# Patient Record
Sex: Female | Born: 1950 | Race: Black or African American | Hispanic: No | Marital: Married | Smoking: Former smoker
Health system: Southern US, Community
[De-identification: ages and names within clinical notes are randomized; demographics above are authoritative.]

## PROBLEM LIST (undated history)

## (undated) DIAGNOSIS — E785 Hyperlipidemia, unspecified: Secondary | ICD-10-CM

## (undated) DIAGNOSIS — I10 Essential (primary) hypertension: Secondary | ICD-10-CM

## (undated) DIAGNOSIS — J45909 Unspecified asthma, uncomplicated: Secondary | ICD-10-CM

## (undated) HISTORY — DX: Unspecified asthma, uncomplicated: J45.909

## (undated) HISTORY — DX: Essential (primary) hypertension: I10

## (undated) HISTORY — PX: COLONOSCOPY: SHX174

## (undated) HISTORY — DX: Hyperlipidemia, unspecified: E78.5

---

## 1981-09-08 HISTORY — PX: ABDOMINAL HYSTERECTOMY: SHX81

## 2018-10-04 ENCOUNTER — Ambulatory Visit: Payer: Self-pay | Admitting: Physician Assistant

## 2018-11-08 ENCOUNTER — Ambulatory Visit
Admission: RE | Admit: 2018-11-08 | Discharge: 2018-11-08 | Disposition: A | Discharge status: Home / Self Care | Payer: Medicare HMO | Source: Ambulatory Visit | Attending: Family Medicine | Admitting: Family Medicine

## 2018-11-08 ENCOUNTER — Other Ambulatory Visit: Payer: Self-pay | Admitting: Family Medicine

## 2018-11-08 DIAGNOSIS — M542 Cervicalgia: Secondary | ICD-10-CM

## 2019-08-25 ENCOUNTER — Other Ambulatory Visit: Payer: Self-pay

## 2019-08-25 DIAGNOSIS — B019 Varicella without complication: Secondary | ICD-10-CM | POA: Insufficient documentation

## 2019-08-25 DIAGNOSIS — E782 Mixed hyperlipidemia: Secondary | ICD-10-CM | POA: Insufficient documentation

## 2019-08-25 DIAGNOSIS — Z9289 Personal history of other medical treatment: Secondary | ICD-10-CM | POA: Insufficient documentation

## 2019-08-25 DIAGNOSIS — J4540 Moderate persistent asthma, uncomplicated: Secondary | ICD-10-CM | POA: Insufficient documentation

## 2019-08-25 DIAGNOSIS — I10 Essential (primary) hypertension: Secondary | ICD-10-CM | POA: Insufficient documentation

## 2019-08-25 HISTORY — DX: Personal history of other medical treatment: Z92.89

## 2019-09-29 ENCOUNTER — Other Ambulatory Visit: Payer: Self-pay

## 2019-09-30 ENCOUNTER — Ambulatory Visit (INDEPENDENT_AMBULATORY_CARE_PROVIDER_SITE_OTHER): Payer: Medicare HMO | Admitting: Family Medicine

## 2019-09-30 ENCOUNTER — Encounter: Payer: Self-pay | Admitting: Family Medicine

## 2019-09-30 VITALS — BP 156/88 | HR 101 | Temp 97.9°F | Ht <= 58 in | Wt 154.4 lb

## 2019-09-30 DIAGNOSIS — I10 Essential (primary) hypertension: Secondary | ICD-10-CM | POA: Diagnosis not present

## 2019-09-30 DIAGNOSIS — M47816 Spondylosis without myelopathy or radiculopathy, lumbar region: Secondary | ICD-10-CM

## 2019-09-30 DIAGNOSIS — Z1231 Encounter for screening mammogram for malignant neoplasm of breast: Secondary | ICD-10-CM | POA: Diagnosis not present

## 2019-09-30 DIAGNOSIS — H409 Unspecified glaucoma: Secondary | ICD-10-CM

## 2019-09-30 DIAGNOSIS — L2084 Intrinsic (allergic) eczema: Secondary | ICD-10-CM

## 2019-09-30 DIAGNOSIS — E669 Obesity, unspecified: Secondary | ICD-10-CM | POA: Diagnosis not present

## 2019-09-30 DIAGNOSIS — M545 Low back pain, unspecified: Secondary | ICD-10-CM

## 2019-09-30 DIAGNOSIS — Z0000 Encounter for general adult medical examination without abnormal findings: Secondary | ICD-10-CM

## 2019-09-30 DIAGNOSIS — L304 Erythema intertrigo: Secondary | ICD-10-CM | POA: Diagnosis not present

## 2019-09-30 DIAGNOSIS — E2839 Other primary ovarian failure: Secondary | ICD-10-CM | POA: Diagnosis not present

## 2019-09-30 DIAGNOSIS — G8929 Other chronic pain: Secondary | ICD-10-CM | POA: Insufficient documentation

## 2019-09-30 DIAGNOSIS — E782 Mixed hyperlipidemia: Secondary | ICD-10-CM

## 2019-09-30 DIAGNOSIS — J4520 Mild intermittent asthma, uncomplicated: Secondary | ICD-10-CM | POA: Diagnosis not present

## 2019-09-30 HISTORY — DX: Unspecified glaucoma: H40.9

## 2019-09-30 HISTORY — DX: Obesity, unspecified: E66.9

## 2019-09-30 HISTORY — DX: Spondylosis without myelopathy or radiculopathy, lumbar region: M47.816

## 2019-09-30 MED ORDER — TRIAMCINOLONE ACETONIDE 0.1 % EX CREA: 1.0000 "application " | TOPICAL_CREAM | Freq: Two times a day (BID) | CUTANEOUS | 0 refills | Status: DC

## 2019-09-30 MED ORDER — KETOCONAZOLE 2 % EX CREA: 1.0000 "application " | TOPICAL_CREAM | Freq: Two times a day (BID) | CUTANEOUS | 0 refills | Status: AC

## 2019-09-30 NOTE — Patient Instructions (Addendum)
Please return in 6 months for follow up of your hypertension. Medicare recommends an Annual Wellness Visit for all patients. Please schedule this to be done with our Nurse Educator, Loma Sousa. This is an informative "talk" visit; it's goals are to ensure that your health care needs are being met and to give you education regarding avoiding falls, ensuring you are not suffering from depression or problems with memory or thinking, and to educate you on Advance Care Planning. It helps me take good care of you!  Your blood pressure is elevated today. Please check at home and return if readings remain > 140/90 for medication adjustment.   Please sign up for Mychart; we sent you an email with a link.  I will release your lab results to you on your MyChart account with further instructions. Please reply with any questions.   Please try to get records for your last colonoscopy, your last stool testing, and your immunizations so I may document them in your chart and ensure everything is up to date.   I have ordered a mammogram and bone density test. We will call you to get that scheduled.   It was a pleasure meeting you today! Thank you for choosing [place] to meet your healthcare needs! I truly look forward to working with you. If you have any questions or concerns, please send me a message via Mychart or call the office at [phone].   Hypertension, Adult High blood pressure (hypertension) is when the force of blood pumping through the arteries is too strong. The arteries are the blood vessels that carry blood from the heart throughout the body. Hypertension forces the heart to work harder to pump blood and may cause arteries to become narrow or stiff. Untreated or uncontrolled hypertension can cause a heart attack, heart failure, a stroke, kidney disease, and other problems. A blood pressure reading consists of a higher number over a lower number. Ideally, your blood pressure should be below 120/80. The  first ("top") number is called the systolic pressure. It is a measure of the pressure in your arteries as your heart beats. The second ("bottom") number is called the diastolic pressure. It is a measure of the pressure in your arteries as the heart relaxes. What are the causes? The exact cause of this condition is not known. There are some conditions that result in or are related to high blood pressure. What increases the risk? Some risk factors for high blood pressure are under your control. The following factors may make you more likely to develop this condition:  Smoking.  Having type 2 diabetes mellitus, high cholesterol, or both.  Not getting enough exercise or physical activity.  Being overweight.  Having too much fat, sugar, calories, or salt (sodium) in your diet.  Drinking too much alcohol. Some risk factors for high blood pressure may be difficult or impossible to change. Some of these factors include:  Having chronic kidney disease.  Having a family history of high blood pressure.  Age. Risk increases with age.  Race. You may be at higher risk if you are African American.  Gender. Men are at higher risk than women before age 56. After age 78, women are at higher risk than men.  Having obstructive sleep apnea.  Stress. What are the signs or symptoms? High blood pressure may not cause symptoms. Very high blood pressure (hypertensive crisis) may cause:  Headache.  Anxiety.  Shortness of breath.  Nosebleed.  Nausea and vomiting.  Vision changes.  Severe  chest pain.  Seizures. How is this diagnosed? This condition is diagnosed by measuring your blood pressure while you are seated, with your arm resting on a flat surface, your legs uncrossed, and your feet flat on the floor. The cuff of the blood pressure monitor will be placed directly against the skin of your upper arm at the level of your heart. It should be measured at least twice using the same arm.  Certain conditions can cause a difference in blood pressure between your right and left arms. Certain factors can cause blood pressure readings to be lower or higher than normal for a short period of time:  When your blood pressure is higher when you are in a health care provider's office than when you are at home, this is called white coat hypertension. Most people with this condition do not need medicines.  When your blood pressure is higher at home than when you are in a health care provider's office, this is called masked hypertension. Most people with this condition may need medicines to control blood pressure. If you have a high blood pressure reading during one visit or you have normal blood pressure with other risk factors, you may be asked to:  Return on a different day to have your blood pressure checked again.  Monitor your blood pressure at home for 1 week or longer. If you are diagnosed with hypertension, you may have other blood or imaging tests to help your health care provider understand your overall risk for other conditions. How is this treated? This condition is treated by making healthy lifestyle changes, such as eating healthy foods, exercising more, and reducing your alcohol intake. Your health care provider may prescribe medicine if lifestyle changes are not enough to get your blood pressure under control, and if:  Your systolic blood pressure is above 130.  Your diastolic blood pressure is above 80. Your personal target blood pressure may vary depending on your medical conditions, your age, and other factors. Follow these instructions at home: Eating and drinking   Eat a diet that is high in fiber and potassium, and low in sodium, added sugar, and fat. An example eating plan is called the DASH (Dietary Approaches to Stop Hypertension) diet. To eat this way: ? Eat plenty of fresh fruits and vegetables. Try to fill one half of your plate at each meal with fruits and  vegetables. ? Eat whole grains, such as whole-wheat pasta, brown rice, or whole-grain bread. Fill about one fourth of your plate with whole grains. ? Eat or drink low-fat dairy products, such as skim milk or low-fat yogurt. ? Avoid fatty cuts of meat, processed or cured meats, and poultry with skin. Fill about one fourth of your plate with lean proteins, such as fish, chicken without skin, beans, eggs, or tofu. ? Avoid pre-made and processed foods. These tend to be higher in sodium, added sugar, and fat.  Reduce your daily sodium intake. Most people with hypertension should eat less than 1,500 mg of sodium a day.  Do not drink alcohol if: ? Your health care provider tells you not to drink. ? You are pregnant, may be pregnant, or are planning to become pregnant.  If you drink alcohol: ? Limit how much you use to:  0-1 drink a day for women.  0-2 drinks a day for men. ? Be aware of how much alcohol is in your drink. In the [place] one drink equals one 12 oz bottle of beer (355 mL), one  5 oz glass of wine (148 mL), or one 1 oz glass of hard liquor (44 mL). Lifestyle   Work with your health care provider to maintain a healthy body weight or to lose weight. Ask what an ideal weight is for you.  Get at least 30 minutes of exercise most days of the week. Activities may include walking, swimming, or biking.  Include exercise to strengthen your muscles (resistance exercise), such as Pilates or lifting weights, as part of your weekly exercise routine. Try to do these types of exercises for 30 minutes at least 3 days a week.  Do not use any products that contain nicotine or tobacco, such as cigarettes, e-cigarettes, and chewing tobacco. If you need help quitting, ask your health care provider.  Monitor your blood pressure at home as told by your health care provider.  Keep all follow-up visits as told by your health care provider. This is important. Medicines  Take over-the-counter and  prescription medicines only as told by your health care provider. Follow directions carefully. Blood pressure medicines must be taken as prescribed.  Do not skip doses of blood pressure medicine. Doing this puts you at risk for problems and can make the medicine less effective.  Ask your health care provider about side effects or reactions to medicines that you should watch for. Contact a health care provider if you:  Think you are having a reaction to a medicine you are taking.  Have headaches that keep coming back (recurring).  Feel dizzy.  Have swelling in your ankles.  Have trouble with your vision. Get help right away if you:  Develop a severe headache or confusion.  Have unusual weakness or numbness.  Feel faint.  Have severe pain in your chest or abdomen.  Vomit repeatedly.  Have trouble breathing. Summary  Hypertension is when the force of blood pumping through your arteries is too strong. If this condition is not controlled, it may put you at risk for serious complications.  Your personal target blood pressure may vary depending on your medical conditions, your age, and other factors. For most people, a normal blood pressure is less than 120/80.  Hypertension is treated with lifestyle changes, medicines, or a combination of both. Lifestyle changes include losing weight, eating a healthy, low-sodium diet, exercising more, and limiting alcohol. This information is not intended to replace advice given to you by your health care provider. Make sure you discuss any questions you have with your health care provider. Document Revised: 05/05/2018 Document Reviewed: 05/05/2018 Elsevier Patient Education  2020 [employer].

## 2019-09-30 NOTE — Progress Notes (Signed)
Subjective  CC:  Chief Complaint  Patient presents with   New Patient (Initial Visit)   Skin rashes   Sleep problems    lack of sleep    HPI: Kathleen Morse is a 69 y.o. female who presents to [facility] today to establish care with me as a new patient.  No records available for review.  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  Reports she has been seeing another primary care doctor in the area who recently left.  She is unable to get old records.  Her main medical problems include hypertension, hyperlipidemia and intermittent asthma.  Hypertension: She reports good control.  Is elevated here in the office today she thinks this is related to anxiety.  She is only on amlodipine.  I did review old lab results from her patient portal on her phone, normal kidney function potassium from March 2020.  She denies history of CAD or diabetes.  She has no chest pain.  Normal extremity edema.  Hyperlipidemia on high-dose pravastatin with most recent LDL of 156 in March 2020.  Tolerates well.  Asthma: Reports uses albuterol rarely.  In her medical chart she does have history of Singulair and Advair but she says she does not use them.  She denies any recent hospitalizations.  She has had lifelong asthma.  Remote history of smoking about 30 or 40 years ago.  No known COPD.  Currently asymptomatic  Mild glaucoma.  She needs a new eye doctor  Health maintenance: She reports she had some type of colon cancer screening kit sent to her home last year that was negative.  Uncertain if this was Cologuard or FIT test.  She reports she had a colonoscopy back in 2013 and that was normal.  No family history of colon cancer.  She is overdue for mammogram.  She is status post hysterectomy, partial for fibroids.  No longer needs Pap smears.  She reports  her immunizations are up-to-date but is on certain about what she had when she had it.  Possibly had one Shingrix at [facility] recently.  She may have had her pneumonia vaccinations but were unclear.  She did take a flu shot this year.  New problem: Rash beneath bilateral breast that she has been there intermittently on and off for months.  Also gets red dry flaking spots on her extremities.  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  She reports it is mild.  She manages behaviorally.  She had naproxen and Ultram on her medication list from before but no longer uses these.  No lower extremity weakness.  No bowel or bladder dysfunction.  Assessment  1. Annual physical exam   2. Essential hypertension   3. Glaucoma, unspecified glaucoma type, unspecified laterality   4. Mild intermittent asthma without complication   5. Mixed hyperlipidemia   6. Encounter for screening mammogram for breast cancer   7. Hypoestrogenism   8. Intrinsic eczema   9. Intertrigo   10. 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 Obesity (BMI 30-39.9)      Plan  Female Wellness Visit:  Age appropriate Health Maintenance and Prevention measures were discussed with patient. Included topics are cancer screening recommendations, ways to keep healthy (see AVS) including dietary and  exercise recommendations, regular eye and dental care, use of seat belts, and avoidance of moderate alcohol use and tobacco use.  Mammogram and bone density ordered.  We will try to get clarification on when her last colorectal cancer screening done and what it was.  I believe she is up-to-date.  BMI: discussed patient's BMI and encouraged positive lifestyle modifications to help get to or maintain a target BMI.  HM needs and immunizations were addressed and ordered. See below for orders. See HM and immunization section for updates.  Request records to see what immunization she  has had a lot she is due for.  Routine labs and screening tests ordered including cmp, cbc and lipids where appropriate.  Discussed recommendations regarding Vit D and calcium supplementation (see AVS)  Chronic problems:  Hypertension: Uncontrolled today but this could be beneficial.  Recommend home monitoring and recheck if remains elevated.  Continue amlodipine.  Check lab work today.  Hyperlipidemia on statin.  Recheck lipids today.  If LDL remains elevated will change to different high-dose statin.  Patient understands and agrees with care plan.  Check LFTs.  Obesity: Recommend improving diet.  Mild intermittent asthma by patient report.  Continue as needed albuterol.  Will need to monitor her breathing and consider PFTs.  Eczema and intertrigo: Educated and treat with ketoconazole and triamcinolone.  Chronic back pain: Manageable behaviorally.  Mild.  Will monitor    Follow up:  Return in about 6 months (around 03/29/2020) for follow up Hypertension, AWV at patient's convenience. Orders Placed This Encounter  Procedures   MM DIGITAL SCREENING BILATERAL   DG Bone Density   CBC with Differential/Platelet   Comprehensive metabolic panel   Hepatitis C antibody   Lipid panel   TSH   Meds ordered this encounter  Medications   ketoconazole (NIZORAL) 2 % cream    Sig: Apply 1 application topically 2 (two) times daily for 7 days. Then as needed to rash beneath breasts    Dispense:  30 g    Refill:  0   triamcinolone cream (KENALOG) 0.1 %    Sig: Apply 1 application topically 2 (two) times daily. For 2 weeks, then as needed    Dispense:  45 g    Refill:  0     Depression screen [facility] 2/9 09/30/2019  Decreased Interest 0  Down, Depressed, Hopeless 0  PHQ - 2 Score 0    We updated and reviewed the patient's past history in detail and it is documented below.  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 09/30/2019   Obesity (BMI 30-39.9) 09/30/2019   Moderate persistent asthma 08/25/2019   Essential hypertension 08/25/2019   Mixed hyperlipidemia 08/25/2019   Health Maintenance  Topic Date Due   Hepatitis C Screening  08/10/1951   MAMMOGRAM  09/18/2001   DEXA SCAN  09/18/2016   PNA vac Low Risk Adult (2 of 2 - PCV13) 08/07/2020   Fecal DNA (Cologuard)  05/09/2022   TETANUS/TDAP  07/07/2029   INFLUENZA VACCINE  Completed   Immunization History  Administered Date(s) Administered   Influenza, High Dose Seasonal PF 06/09/2018   Influenza,inj,Quad PF,6+ Mos 06/08/2019   Pneumococcal Polysaccharide-23 08/08/2019   Tdap 07/08/2019   Zoster Recombinat (Shingrix) 07/08/2019   Current Meds  Medication Sig   albuterol (VENTOLIN HFA) 108 (90 Base) MCG/ACT inhaler Inhale  into the lungs every 6 (six) hours as needed for wheezing or shortness of breath.   amLODipine (NORVASC) 10 MG tablet Take 10 mg by mouth daily.   b complex vitamins capsule Take 1 capsule by mouth daily.   co-enzyme Q-10 30 MG capsule Take 30 mg by mouth 3 (three) times daily.   latanoprost (XALATAN) 0.005 % ophthalmic solution 1 drop at bedtime.   pravastatin (PRAVACHOL) 80 MG tablet Take 80 mg by mouth daily.   [DISCONTINUED] clobetasol cream (TEMOVATE) 9.41 % Apply 1 application topically 2 (two) times daily.   [DISCONTINUED] Fluticasone-Salmeterol (ADVAIR) 100-50 MCG/DOSE AEPB Inhale 1 puff into the lungs 2 (two) times daily.   [DISCONTINUED] montelukast (SINGULAIR) 10 MG tablet Take 10 mg by mouth at bedtime.   [DISCONTINUED] naproxen (NAPROSYN) 500 MG tablet Take 500 mg by mouth 2 (two) times daily with a meal.    Allergies: Patient is allergic to penicillins. 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 Past Surgical History Patient  has a past surgical history that includes Abdominal hysterectomy (1983) and Cesarean section (1971). Family History: Patient family history includes Arthritis in her mother; Birth defects in her mother; Cancer in her sister; Depression in her father; Diabetes in her mother; Heart attack in her maternal grandmother; Stroke in her maternal grandfather. Social History:  Patient  reports that she has quit smoking. She has never used smokeless tobacco. She reports current alcohol use. She reports that she does not use drugs.  Review of Systems: Constitutional: negative for fever or malaise Ophthalmic: negative for photophobia, double vision or loss of vision Cardiovascular: negative for chest pain, dyspnea on exertion, or new LE swelling Respiratory: negative for SOB or persistent cough Gastrointestinal: negative for abdominal pain, change in bowel habits or melena Genitourinary: negative for dysuria or gross hematuria Musculoskeletal: negative for new gait disturbance or muscular weakness Integumentary: negative for new or persistent rashes Neurological: negative for TIA or stroke symptoms Psychiatric: negative for SI or delusions Allergic/Immunologic: negative for hives  Patient Care Team    Relationship Specialty Notifications Start End  Leamon Arnt, MD PCP - General Family Medicine  09/30/19     Objective  Vitals: BP (!) 156/88 (BP Location: Left Arm, Patient Position: Sitting, Cuff Size: Normal)    Pulse (!) 101    Temp 97.9 F (36.6 C) (Temporal)    Ht '4\' 10"'  (1.473 m)    Wt 154 lb 6.4 oz (70 kg)    SpO2 97%    BMI 32.27 kg/m  General:  Well developed, well nourished, no acute distress  Psych:  Alert and oriented,normal mood and affect HEENT:  Normocephalic, atraumatic, non-icteric sclera, PERRL,  oropharynx is without mass or exudate, supple neck without adenopathy, mass or thyromegaly Cardiovascular:  RRR without gallop, rub or murmur, nondisplaced PMI Respiratory:  Good breath sounds bilaterally, CTAB with normal respiratory effort Gastrointestinal: normal bowel sounds, soft, non-tender, no noted masses. No HSM MSK: no deformities, contusions. Joints are without erythema or swelling Skin:  Warm, erythematous moist rash beneath bilateral breast: Small red flaking rash on left forearm.  No suspicious lesions Neurologic:    Mental status is normal. Gross motor and sensory exams are normal. Normal gait   Commons side effects, risks, benefits, and alternatives for medications and treatment plan prescribed today were discussed, and the patient  expressed understanding of the given instructions. Patient is instructed to call or message via MyChart if he/she has any questions or concerns regarding our treatment plan. No barriers to understanding were identified. We discussed Red Flag symptoms and signs in detail. Patient expressed understanding regarding what to do in case of urgent or emergency type symptoms.   Medication list was reconciled, printed and provided to the patient in AVS. Patient instructions and summary information was reviewed with the patient as documented in the AVS. This note was prepared with assistance of Dragon voice recognition software. Occasional wrong-word or sound-a-like substitutions may have occurred due to the inherent limitations of voice recognition software  This visit occurred during the SARS-CoV-2 public health emergency.  Safety protocols were in place, including screening questions prior to the visit, additional usage of staff PPE, and extensive cleaning of exam room while observing appropriate contact time as indicated for disinfecting solutions.

## 2019-10-01 NOTE — Progress Notes (Signed)
Please call patient: I have reviewed his/her lab results. Lab results appear stable.  Cholesterol level is improved from last year. WBC is elevated: this may be chronic but need old records to verify. Will recheck again at next visit.  Other labs are all fine. No changes recommended at this time. thanks

## 2019-10-03 LAB — CBC WITH DIFFERENTIAL/PLATELET
Absolute Monocytes: 773 cells/uL (ref 200–950)
Basophils Absolute: 55 cells/uL (ref 0–200)
Basophils Relative: 0.4 %
Eosinophils Absolute: 193 cells/uL (ref 15–500)
Eosinophils Relative: 1.4 %
HCT: 39.4 % (ref 35.0–45.0)
Hemoglobin: 13.1 g/dL (ref 11.7–15.5)
Lymphs Abs: 4250 cells/uL — ABNORMAL HIGH (ref 850–3900)
MCH: 29.6 pg (ref 27.0–33.0)
MCHC: 33.2 g/dL (ref 32.0–36.0)
MCV: 88.9 fL (ref 80.0–100.0)
MPV: 10.6 fL (ref 7.5–12.5)
Monocytes Relative: 5.6 %
Neutro Abs: 8528 cells/uL — ABNORMAL HIGH (ref 1500–7800)
Neutrophils Relative %: 61.8 %
Platelets: 382 10*3/uL (ref 140–400)
RBC: 4.43 10*6/uL (ref 3.80–5.10)
RDW: 12.6 % (ref 11.0–15.0)
Total Lymphocyte: 30.8 %
WBC: 13.8 10*3/uL — ABNORMAL HIGH (ref 3.8–10.8)

## 2019-10-03 LAB — COMPREHENSIVE METABOLIC PANEL
AG Ratio: 1.3 (calc) (ref 1.0–2.5)
ALT: 17 U/L (ref 6–29)
AST: 19 U/L (ref 10–35)
Albumin: 4.2 g/dL (ref 3.6–5.1)
Alkaline phosphatase (APISO): 55 U/L (ref 37–153)
BUN: 12 mg/dL (ref 7–25)
CO2: 27 mmol/L (ref 20–32)
Calcium: 10.4 mg/dL (ref 8.6–10.4)
Chloride: 101 mmol/L (ref 98–110)
Creat: 0.88 mg/dL (ref 0.50–0.99)
Globulin: 3.2 g/dL (calc) (ref 1.9–3.7)
Glucose, Bld: 89 mg/dL (ref 65–99)
Potassium: 3.8 mmol/L (ref 3.5–5.3)
Sodium: 141 mmol/L (ref 135–146)
Total Bilirubin: 0.4 mg/dL (ref 0.2–1.2)
Total Protein: 7.4 g/dL (ref 6.1–8.1)

## 2019-10-03 LAB — LIPID PANEL
Cholesterol: 200 mg/dL — ABNORMAL HIGH (ref <200)
HDL: 52 mg/dL (ref >=50)
LDL Cholesterol (Calc): 126 mg/dL (calc) — ABNORMAL HIGH
Non-HDL Cholesterol (Calc): 148 mg/dL (calc) — ABNORMAL HIGH (ref <130)
Total CHOL/HDL Ratio: 3.8 (calc) (ref <5.0)
Triglycerides: 111 mg/dL (ref <150)

## 2019-10-03 LAB — HEPATITIS C ANTIBODY
Hepatitis C Ab: NONREACTIVE
SIGNAL TO CUT-OFF: 0.01 (ref <1.00)

## 2019-10-03 LAB — TSH: TSH: 1.74 mIU/L (ref 0.40–4.50)

## 2019-10-25 ENCOUNTER — Telehealth: Payer: Self-pay

## 2019-10-25 MED ORDER — AMLODIPINE BESYLATE 10 MG PO TABS: 10.0000 mg | ORAL_TABLET | Freq: Every day | ORAL | 3 refills | Status: DC

## 2019-10-25 NOTE — Telephone Encounter (Signed)
MEDICATION:amLODipine ([place]) 10 MG tablet  PHARMACY: [place], [place] Phone:  [phone]  Fax:  [phone]       Comments:   **Let patient know to contact pharmacy at the end of the day to make sure medication is ready. **  ** Please notify patient to allow 48-72 hours to process**  **Encourage patient to contact the pharmacy for refills or they can request refills through [facility]**

## 2019-10-25 NOTE — Addendum Note (Signed)
Addended by: Clyde Lundborg A on: 10/25/2019 10:16 AM   Modules accepted: Orders

## 2019-10-25 NOTE — Telephone Encounter (Signed)
Medication refilled

## 2019-12-07 ENCOUNTER — Other Ambulatory Visit: Payer: Self-pay

## 2019-12-07 MED ORDER — ALBUTEROL SULFATE HFA 108 (90 BASE) MCG/ACT IN AERS: 1.0000 | INHALATION_SPRAY | Freq: Four times a day (QID) | RESPIRATORY_TRACT | 1 refills | Status: DC | PRN

## 2019-12-19 ENCOUNTER — Ambulatory Visit (INDEPENDENT_AMBULATORY_CARE_PROVIDER_SITE_OTHER): Payer: Medicare HMO

## 2019-12-19 ENCOUNTER — Other Ambulatory Visit: Payer: Self-pay

## 2019-12-19 DIAGNOSIS — Z1211 Encounter for screening for malignant neoplasm of colon: Secondary | ICD-10-CM | POA: Diagnosis not present

## 2019-12-19 DIAGNOSIS — Z0000 Encounter for general adult medical examination without abnormal findings: Secondary | ICD-10-CM

## 2019-12-19 NOTE — Patient Instructions (Signed)
Kathleen Morse , Thank you for taking time to come for your Medicare Wellness Visit. I appreciate your ongoing commitment to your health goals. Please review the following plan we discussed and let me know if I can assist you in the future.   Screening recommendations/referrals: Colorectal Screening: Cologuard ordered today Mammogram: scheduled for 12/26/19 Bone Density: scheduled for 12/26/19   Vision and Dental Exams: Recommended annual ophthalmology exams for early detection of glaucoma and other disorders of the eye Recommended annual dental exams for proper oral hygiene  Vaccinations: Influenza vaccine: completed 06/07/20 Pneumococcal vaccine: up to date; last 08/08/19 (Prevnar recommended after 08/07/20) Tdap vaccine: up to date; last 07/08/19  Shingles vaccine: Noted that 1st Shingrix received 07/08/19, 2nd part of vaccine due before 01/06/20 (see handout)  Covid vaccine:  Completed   Advanced directives: Advance directives discussed with you today. I have provided a copy for you to complete at home and have notarized. Once this is complete please bring a copy in to our office so we can scan it into your chart.  Goals: Recommend to drink at least 6-8 8oz glasses of water per day and consume a balanced diet rich in fresh fruits and vegetables.   Next appointment: Please schedule your Annual Wellness Visit with your Nurse Health Advisor in one year.  Preventive Care 68 Years and Older, Female Preventive care refers to lifestyle choices and visits with your health care provider that can promote health and wellness. What does preventive care include?  A yearly physical exam. This is also called an annual well check.  Dental exams once or twice a year.  Routine eye exams. Ask your health care provider how often you should have your eyes checked.  Personal lifestyle choices, including:  Daily care of your teeth and gums.  Regular physical activity.  Eating a healthy  diet.  Avoiding tobacco and drug use.  Limiting alcohol use.  Practicing safe sex.  Taking low-dose aspirin every day if recommended by your health care provider.  Taking vitamin and mineral supplements as recommended by your health care provider. What happens during an annual well check? The services and screenings done by your health care provider during your annual well check will depend on your age, overall health, lifestyle risk factors, and family history of disease. Counseling  Your health care provider may ask you questions about your:  Alcohol use.  Tobacco use.  Drug use.  Emotional well-being.  Home and relationship well-being.  Sexual activity.  Eating habits.  History of falls.  Memory and ability to understand (cognition).  Work and work Statistician.  Reproductive health. Screening  You may have the following tests or measurements:  Height, weight, and BMI.  Blood pressure.  Lipid and cholesterol levels. These may be checked every 5 years, or more frequently if you are over 68 years old.  Skin check.  Lung cancer screening. You may have this screening every year starting at age 68 if you have a 30-pack-year history of smoking and currently smoke or have quit within the past 15 years.  Fecal occult blood test (FOBT) of the stool. You may have this test every year starting at age 68.  Flexible sigmoidoscopy or colonoscopy. You may have a sigmoidoscopy every 5 years or a colonoscopy every 10 years starting at age 68.  Hepatitis C blood test.  Hepatitis B blood test.  Sexually transmitted disease (STD) testing.  Diabetes screening. This is done by checking your blood sugar (glucose) after you have not  eaten for a while (fasting). You may have this done every 1-3 years.  Bone density scan. This is done to screen for osteoporosis. You may have this done starting at age 68.  Mammogram. This may be done every 1-2 years. Talk to your health care  provider about how often you should have regular mammograms. Talk with your health care provider about your test results, treatment options, and if necessary, the need for more tests. Vaccines  Your health care provider may recommend certain vaccines, such as:  Influenza vaccine. This is recommended every year.  Tetanus, diphtheria, and acellular pertussis (Tdap, Td) vaccine. You may need a Td booster every 10 years.  Zoster vaccine. You may need this after age 68.  Pneumococcal 13-valent conjugate (PCV13) vaccine. One dose is recommended after age 68.  Pneumococcal polysaccharide (PPSV23) vaccine. One dose is recommended after age 68. Talk to your health care provider about which screenings and vaccines you need and how often you need them. This information is not intended to replace advice given to you by your health care provider. Make sure you discuss any questions you have with your health care provider. Document Released: 09/21/2015 Document Revised: 05/14/2016 Document Reviewed: 06/26/2015 Elsevier Interactive Patient Education  2017 [place] Prevention in the Home Falls can cause injuries. They can happen to people of all ages. There are many things you can do to make your home safe and to help prevent falls. What can I do on the outside of my home?  Regularly fix the edges of walkways and driveways and fix any cracks.  Remove anything that might make you trip as you walk through a door, such as a raised step or threshold.  Trim any bushes or trees on the path to your home.  Use bright outdoor lighting.  Clear any walking paths of anything that might make someone trip, such as rocks or tools.  Regularly check to see if handrails are loose or broken. Make sure that both sides of any steps have handrails.  Any raised decks and porches should have guardrails on the edges.  Have any leaves, snow, or ice cleared regularly.  Use sand or salt on walking paths  during winter.  Clean up any spills in your garage right away. This includes oil or grease spills. What can I do in the bathroom?  Use night lights.  Install grab bars by the toilet and in the tub and shower. Do not use towel bars as grab bars.  Use non-skid mats or decals in the tub or shower.  If you need to sit down in the shower, use a plastic, non-slip stool.  Keep the floor dry. Clean up any water that spills on the floor as soon as it happens.  Remove soap buildup in the tub or shower regularly.  Attach bath mats securely with double-sided non-slip rug tape.  Do not have throw rugs and other things on the floor that can make you trip. What can I do in the bedroom?  Use night lights.  Make sure that you have a light by your bed that is easy to reach.  Do not use any sheets or blankets that are too big for your bed. They should not hang down onto the floor.  Have a firm chair that has side arms. You can use this for support while you get dressed.  Do not have throw rugs and other things on the floor that can make you trip. What can I  do in the kitchen?  Clean up any spills right away.  Avoid walking on wet floors.  Keep items that you use a lot in easy-to-reach places.  If you need to reach something above you, use a strong step stool that has a grab bar.  Keep electrical cords out of the way.  Do not use floor polish or wax that makes floors slippery. If you must use wax, use non-skid floor wax.  Do not have throw rugs and other things on the floor that can make you trip. What can I do with my stairs?  Do not leave any items on the stairs.  Make sure that there are handrails on both sides of the stairs and use them. Fix handrails that are broken or loose. Make sure that handrails are as long as the stairways.  Check any carpeting to make sure that it is firmly attached to the stairs. Fix any carpet that is loose or worn.  Avoid having throw rugs at the top  or bottom of the stairs. If you do have throw rugs, attach them to the floor with carpet tape.  Make sure that you have a light switch at the top of the stairs and the bottom of the stairs. If you do not have them, ask someone to add them for you. What else can I do to help prevent falls?  Wear shoes that:  Do not have high heels.  Have rubber bottoms.  Are comfortable and fit you well.  Are closed at the toe. Do not wear sandals.  If you use a stepladder:  Make sure that it is fully opened. Do not climb a closed stepladder.  Make sure that both sides of the stepladder are locked into place.  Ask someone to hold it for you, if possible.  Clearly mark and make sure that you can see:  Any grab bars or handrails.  First and last steps.  Where the edge of each step is.  Use tools that help you move around (mobility aids) if they are needed. These include:  Canes.  Walkers.  Scooters.  Crutches.  Turn on the lights when you go into a dark area. Replace any light bulbs as soon as they burn out.  Set up your furniture so you have a clear path. Avoid moving your furniture around.  If any of your floors are uneven, fix them.  If there are any pets around you, be aware of where they are.  Review your medicines with your doctor. Some medicines can make you feel dizzy. This can increase your chance of falling. Ask your doctor what other things that you can do to help prevent falls. This information is not intended to replace advice given to you by your health care provider. Make sure you discuss any questions you have with your health care provider. Document Released: 06/21/2009 Document Revised: 01/31/2016 Document Reviewed: 09/29/2014 Elsevier Interactive Patient Education  2017 [employer].

## 2019-12-19 NOTE — Progress Notes (Signed)
This visit is being conducted via phone call due to the COVID-19 pandemic. This patient has given me verbal consent via phone to conduct this visit, patient states they are participating from their home address. Some vital signs may be absent or patient reported.   Patient identification: identified by name, DOB, and current address.  Location provider: [facility], Office Persons participating in the virtual visit: Denman George LPN, patient, and Dr. Billey Chang     Subjective:   Kathleen Morse is a 69 y.o. female who presents for an Initial Medicare Annual Wellness Visit.  Review of Systems     Cardiac Risk Factors include: advanced age (>60men, >24 women);dyslipidemia;hypertension    Objective:    There were no vitals filed for this visit. There is no height or weight on file to calculate BMI.  Advanced Directives 12/19/2019  Does Patient Have a Medical Advance Directive? No  Would patient like information on creating a medical advance directive? Yes (MAU/Ambulatory/Procedural Areas - Information given)    Current Medications (verified) Outpatient Encounter Medications as of 12/19/2019  Medication Sig  . albuterol (VENTOLIN HFA) 108 (90 Base) MCG/ACT inhaler Inhale 1-2 puffs into the lungs every 6 (six) hours as needed for wheezing or shortness of breath.  Marland Kitchen amLODipine (NORVASC) 10 MG tablet Take 1 tablet (10 mg total) by mouth daily.  Marland Kitchen b complex vitamins capsule Take 1 capsule by mouth daily.  Marland Kitchen co-enzyme Q-10 30 MG capsule Take 30 mg by mouth 3 (three) times daily.  Marland Kitchen gabapentin (NEURONTIN) 100 MG capsule Take 100 mg by mouth 3 (three) times daily.  Marland Kitchen latanoprost (XALATAN) 0.005 % ophthalmic solution 1 drop at bedtime.  . montelukast (SINGULAIR) 10 MG tablet Take 10 mg by mouth at bedtime.  . pravastatin (PRAVACHOL) 80 MG tablet Take 80 mg by mouth daily.  Marland Kitchen triamcinolone cream (KENALOG) 0.1 % Apply 1 application topically 2 (two) times daily. For 2 weeks, then as  needed   No facility-administered encounter medications on file as of 12/19/2019.    Allergies (verified) Penicillins   History: Past Medical History:  Diagnosis Date  . Asthma   . Glaucoma 09/30/2019  . History of blood transfusion 08/25/2019  . Hyperlipidemia   . Hypertension   . Obesity (BMI 30-39.9) 09/30/2019  . 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 ABDOMINAL HYSTERECTOMY  1983  . CESAREAN SECTION  1971   Family History  Problem Relation Age of Onset  . Arthritis Mother   . Birth defects Mother   . Diabetes Mother   . Depression Father   . Cancer Sister   . Heart attack Maternal Grandmother   . Stroke Maternal Grandfather    Social History   Socioeconomic History  . Marital status: Married    Spouse name: Not on file  . Number of children: 1  . Years of education: Not on file  . Highest education level: Not on file  Occupational History  . Occupation: Optometrist retired  Tobacco Use  . Smoking status: Former Research scientist (life sciences)  . Smokeless tobacco: Never Used  Substance and Sexual Activity  . Alcohol use: Yes  . Drug use: Never  . Sexual activity: Not on file  Other Topics Concern  . Not on file  Social History Narrative  . Not on file   Social Determinants of Health   Financial Resource Strain:   . Difficulty of Paying Living Expenses:  Food Insecurity:   . Worried About Charity fundraiser in the Last Year:   . Arboriculturist in the Last Year:   Transportation Needs:   . Film/video editor (Medical):   Marland Kitchen Lack of Transportation (Non-Medical):   Physical Activity:   . Days of Exercise per Week:   . Minutes of Exercise per Session:   Stress:   . Feeling of Stress :   Social Connections:   . Frequency of Communication with Friends and Family:   . Frequency of Social Gatherings with Friends and Family:   . Attends Religious Services:   . Active Member of  Clubs or Organizations:   . Attends Archivist Meetings:   Marland Kitchen Marital Status:     Tobacco Counseling Counseling given: Not Answered   Clinical Intake:  Pre-visit preparation completed: Yes  Pain : No/denies pain  Diabetes: No  How often do you need to have someone help you when you read instructions, pamphlets, or other written materials from your doctor or pharmacy?: 1 - Never  Interpreter Needed?: No  Information entered by :: Denman George LPN   Activities of Daily Living In your present state of health, do you have any difficulty performing the following activities: 12/19/2019  Hearing? N  Vision? N  Difficulty concentrating or making decisions? N  Walking or climbing stairs? N  Dressing or bathing? N  Doing errands, shopping? N  Preparing Food and eating ? N  Using the Toilet? N  In the past six months, have you accidently leaked urine? N  Do you have problems with loss of bowel control? N  Managing your Medications? N  Managing your Finances? N  Housekeeping or managing your Housekeeping? N  Some recent data might be hidden     Immunizations and Health Maintenance Immunization History  Administered Date(s) Administered  . Influenza, High Dose Seasonal PF 06/09/2018  . Influenza,inj,Quad PF,6+ Mos 06/08/2019  . Pneumococcal Polysaccharide-23 08/08/2019  . Tdap 07/08/2019  . Zoster Recombinat (Shingrix) 07/08/2019   Health Maintenance Due  Topic Date Due  . MAMMOGRAM  Never done  . DEXA SCAN  Never done    Patient Care Team: Leamon Arnt, MD as PCP - General (Family Medicine)  Indicate any recent Medical Services you may have received from other than [facility] providers in the past year (date may be approximate).     Assessment:   This is a routine wellness examination for [place].  Hearing/Vision screen No exam data present  Dietary issues and exercise activities discussed: Current Exercise Habits: The patient does not participate in  regular exercise at present  Goals   None    Depression Screen PHQ 2/9 Scores 12/19/2019 09/30/2019  PHQ - 2 Score 0 0    Fall Risk Fall Risk  12/19/2019  Falls in the past year? 0  Number falls in past yr: 0  Injury with Fall? 0  Follow up Falls evaluation completed;Education provided;Falls prevention discussed    Is the patient's home free of loose throw rugs in walkways, pet beds, electrical cords, etc?   yes      Grab bars in the bathroom? yes      Handrails on the stairs?   yes      Adequate lighting?   yes   Cognitive Function: no cognitive concerns at this time    6CIT Screen 12/19/2019  What Year? 0 points  What month? 0 points  What time? 0 points  Count back  from 20 0 points  Months in reverse 0 points  Repeat phrase 0 points  Total Score 0    Screening Tests Health Maintenance  Topic Date Due  . MAMMOGRAM  Never done  . DEXA SCAN  Never done  . INFLUENZA VACCINE  04/08/2020  . PNA vac Low Risk Adult (2 of 2 - PCV13) 08/07/2020  . Fecal DNA (Cologuard)  05/09/2022  . TETANUS/TDAP  07/07/2029  . Hepatitis C Screening  Completed    Qualifies for Shingles Vaccine? Patient has completed 1st Shingrix in series and is aware that 2nd is needed   Cancer Screenings: Lung: Low Dose CT Chest recommended if Age 62-80 years, 30 pack-year currently smoking OR have quit w/in 15years. Patient does not qualify. Breast: Up to date on Mammogram? Yes, scheduled for 12/26/19 Up to date of Bone Density/Dexa? Yes, scheduled for 12/26/19  Colorectal: Cologuard ordered     Plan:  I have personally reviewed and addressed the Medicare Annual Wellness questionnaire and have noted the following in the patient's chart:  A. Medical and social history B. Use of alcohol, tobacco or illicit drugs  C. Current medications and supplements D. Functional ability and status E.  Nutritional status F.  Physical activity G. Advance directives H. List of other physicians I.   Hospitalizations, surgeries, and ER visits in previous 12 months J.  [place] such as hearing and vision if needed, cognitive and depression L. Referrals, records requested, and appointments- Cologuard ordered   In addition, I have reviewed and discussed with patient certain preventive protocols, quality metrics, and best practice recommendations. A written personalized care plan for preventive services as well as general preventive health recommendations were provided to patient.   Signed,  Denman George, LPN  Nurse Health Advisor   Nurse Notes: Patient has completed Covid vaccine

## 2019-12-26 ENCOUNTER — Ambulatory Visit
Admission: RE | Admit: 2019-12-26 | Discharge: 2019-12-26 | Disposition: A | Discharge status: Home / Self Care | Payer: Medicare HMO | Source: Ambulatory Visit | Attending: Family Medicine | Admitting: Family Medicine

## 2019-12-26 ENCOUNTER — Other Ambulatory Visit: Payer: Self-pay

## 2019-12-26 DIAGNOSIS — E2839 Other primary ovarian failure: Secondary | ICD-10-CM

## 2019-12-26 DIAGNOSIS — M85852 Other specified disorders of bone density and structure, left thigh: Secondary | ICD-10-CM | POA: Diagnosis not present

## 2019-12-26 DIAGNOSIS — R6889 Other general symptoms and signs: Secondary | ICD-10-CM | POA: Diagnosis not present

## 2019-12-26 DIAGNOSIS — Z1231 Encounter for screening mammogram for malignant neoplasm of breast: Secondary | ICD-10-CM | POA: Diagnosis not present

## 2019-12-26 DIAGNOSIS — Z780 Asymptomatic menopausal state: Secondary | ICD-10-CM | POA: Diagnosis not present

## 2019-12-27 ENCOUNTER — Encounter: Payer: Self-pay | Admitting: Family Medicine

## 2019-12-27 DIAGNOSIS — M8580 Other specified disorders of bone density and structure, unspecified site: Secondary | ICD-10-CM

## 2019-12-27 DIAGNOSIS — Z780 Asymptomatic menopausal state: Secondary | ICD-10-CM

## 2019-12-27 HISTORY — DX: Asymptomatic menopausal state: Z78.0

## 2019-12-27 HISTORY — DX: Other specified disorders of bone density and structure, unspecified site: M85.80

## 2020-01-03 ENCOUNTER — Encounter: Payer: Self-pay | Admitting: Family Medicine

## 2020-01-03 DIAGNOSIS — Z1211 Encounter for screening for malignant neoplasm of colon: Secondary | ICD-10-CM | POA: Diagnosis not present

## 2020-01-03 LAB — COLOGUARD

## 2020-01-07 LAB — EXTERNAL GENERIC LAB PROCEDURE: COLOGUARD: NEGATIVE

## 2020-01-07 LAB — COLOGUARD
COLOGUARD: NEGATIVE
Cologuard: NEGATIVE

## 2020-02-29 DIAGNOSIS — H5213 Myopia, bilateral: Secondary | ICD-10-CM | POA: Diagnosis not present

## 2020-04-09 ENCOUNTER — Encounter: Payer: Self-pay | Admitting: Family Medicine

## 2020-04-09 ENCOUNTER — Other Ambulatory Visit: Payer: Self-pay

## 2020-04-09 ENCOUNTER — Ambulatory Visit (INDEPENDENT_AMBULATORY_CARE_PROVIDER_SITE_OTHER): Payer: Medicare HMO | Admitting: Family Medicine

## 2020-04-09 VITALS — BP 130/82 | HR 88 | Temp 97.7°F | Resp 15 | Ht <= 58 in | Wt 153.4 lb

## 2020-04-09 DIAGNOSIS — D170 Benign lipomatous neoplasm of skin and subcutaneous tissue of head, face and neck: Secondary | ICD-10-CM

## 2020-04-09 DIAGNOSIS — D72829 Elevated white blood cell count, unspecified: Secondary | ICD-10-CM | POA: Diagnosis not present

## 2020-04-09 DIAGNOSIS — G8929 Other chronic pain: Secondary | ICD-10-CM | POA: Diagnosis not present

## 2020-04-09 DIAGNOSIS — M47812 Spondylosis without myelopathy or radiculopathy, cervical region: Secondary | ICD-10-CM | POA: Insufficient documentation

## 2020-04-09 DIAGNOSIS — I10 Essential (primary) hypertension: Secondary | ICD-10-CM | POA: Diagnosis not present

## 2020-04-09 DIAGNOSIS — M654 Radial styloid tenosynovitis [de Quervain]: Secondary | ICD-10-CM

## 2020-04-09 DIAGNOSIS — M545 Low back pain, unspecified: Secondary | ICD-10-CM

## 2020-04-09 DIAGNOSIS — M47816 Spondylosis without myelopathy or radiculopathy, lumbar region: Secondary | ICD-10-CM

## 2020-04-09 DIAGNOSIS — L2084 Intrinsic (allergic) eczema: Secondary | ICD-10-CM | POA: Diagnosis not present

## 2020-04-09 LAB — CBC WITH DIFFERENTIAL/PLATELET
Absolute Monocytes: 725 cells/uL (ref 200–950)
Basophils Absolute: 35 cells/uL (ref 0–200)
Basophils Relative: 0.3 %
Eosinophils Absolute: 105 cells/uL (ref 15–500)
Eosinophils Relative: 0.9 %
HCT: 39.8 % (ref 35.0–45.0)
Hemoglobin: 13.2 g/dL (ref 11.7–15.5)
Lymphs Abs: 2457 cells/uL (ref 850–3900)
MCH: 29.9 pg (ref 27.0–33.0)
MCHC: 33.2 g/dL (ref 32.0–36.0)
MCV: 90.2 fL (ref 80.0–100.0)
MPV: 10.5 fL (ref 7.5–12.5)
Monocytes Relative: 6.2 %
Neutro Abs: 8377 cells/uL — ABNORMAL HIGH (ref 1500–7800)
Neutrophils Relative %: 71.6 %
Platelets: 433 10*3/uL — ABNORMAL HIGH (ref 140–400)
RBC: 4.41 10*6/uL (ref 3.80–5.10)
RDW: 12.7 % (ref 11.0–15.0)
Total Lymphocyte: 21 %
WBC: 11.7 10*3/uL — ABNORMAL HIGH (ref 3.8–10.8)

## 2020-04-09 MED ORDER — DICLOFENAC SODIUM 75 MG PO TBEC: 75.0000 mg | DELAYED_RELEASE_TABLET | Freq: Two times a day (BID) | ORAL | 2 refills | Status: DC

## 2020-04-09 MED ORDER — TRIAMCINOLONE ACETONIDE 0.1 % EX CREA: 1.0000 "application " | TOPICAL_CREAM | Freq: Two times a day (BID) | CUTANEOUS | 0 refills | Status: DC

## 2020-04-09 NOTE — Patient Instructions (Addendum)
Please return in 6 months for your annual complete physical; please come fasting.  I believe you have eczema: short luke warm showers are helpful; aveeno baths can be helpful. Avoid scratching and use the steroid cream for red flaking rash short term as needed.   Your blood pressure looks good.   If you have any questions or concerns, please don't hesitate to send me a message via MyChart or call the office at [phone]. Thank you for visiting with [place] today! It's our pleasure caring for you.   De Quervain's Tenosynovitis  De Quervain's tenosynovitis is a condition that causes inflammation of the tendon on the thumb side of the wrist. Tendons are cords of tissue that connect bones to muscles. The tendons in the hand pass through a tunnel called a sheath. A slippery layer of tissue (synovium) lets the tendons move smoothly in the sheath. With de Quervain's tenosynovitis, the sheath swells or thickens, causing friction and pain. The condition is also called de Quervain's disease and de Quervain's syndrome. It occurs most often in women who are 74-59 years old. What are the causes? The exact cause of this condition is not known. It may be associated with overuse of the hand and wrist. What increases the risk? You are more likely to develop this condition if you:  Use your hands far more than normal, especially if you repeat certain movements that involve twisting your hand or using a tight grip.  Are pregnant.  Are a middle-aged woman.  Have rheumatoid arthritis.  Have diabetes. What are the signs or symptoms? The main symptom of this condition is pain on the thumb side of the wrist. The pain may get worse when you grasp something or turn your wrist. Other symptoms may include:  Pain that extends up the forearm.  Swelling of your wrist and hand.  Trouble moving the thumb and wrist.  A sensation of snapping in the wrist.  A bump filled with fluid (cyst) in the area of the  pain. How is this diagnosed? This condition may be diagnosed based on:  Your symptoms and medical history.  A physical exam. During the exam, your health care provider may do a simple test Wynn Maudlin test) that involves pulling your thumb and wrist to see if this causes pain. You may also need to have an X-ray. How is this treated? Treatment for this condition may include:  Avoiding any activity that causes pain and swelling.  Taking medicines. Anti-inflammatory medicines and corticosteroid injections may be used to reduce inflammation and relieve pain.  Wearing a splint.  Having surgery. This may be needed if other treatments do not work. Once the pain and swelling has gone down:  Physical therapy. This includes stretching and strengthening exercises.  Occupational therapy. This includes adjusting how you move your wrist. Follow these instructions at home: If you have a splint:  Wear the splint as told by your health care provider. Remove it only as told by your health care provider.  Loosen the splint if your fingers tingle, become numb, or turn cold and blue.  Keep the splint clean.  If the splint is not waterproof: ? Do not let it get wet. ? Cover it with a watertight covering when you take a bath or a shower. Managing pain, stiffness, and swelling   Avoid movements and activities that cause pain and swelling in the wrist area.  If directed, put ice on the painful area. This may be helpful after doing activities that involve  the sore wrist. ? Put ice in a plastic bag. ? Place a towel between your skin and the bag. ? Leave the ice on for 20 minutes, 2-3 times a day.  Move your fingers often to avoid stiffness and to lessen swelling.  Raise (elevate) the injured area above the level of your heart while you are sitting or lying down. General instructions  Return to your normal activities as told by your health care provider. Ask your health care provider what  activities are safe for you.  Take over-the-counter and prescription medicines only as told by your health care provider.  Keep all follow-up visits as told by your health care provider. This is important. Contact a health care provider if:  Your pain medicine does not help.  Your pain gets worse.  You develop new symptoms. Summary  De Quervain's tenosynovitis is a condition that causes inflammation of the tendon on the thumb side of the wrist.  The condition occurs most often in women who are 87-36 years old.  The exact cause of this condition is not known. It may be associated with overuse of the hand and wrist.  Treatment starts with avoiding activity that causes pain or swelling in the wrist area. Other treatment may include wearing a splint and taking medicine. Sometimes, surgery is needed. This information is not intended to replace advice given to you by your health care provider. Make sure you discuss any questions you have with your health care provider. Document Revised: 02/25/2018 Document Reviewed: 08/03/2017 Elsevier Patient Education  2020 [employer].

## 2020-04-09 NOTE — Addendum Note (Signed)
Addended by: Liliane Channel on: 04/09/2020 10:06 AM   Modules accepted: Orders

## 2020-04-09 NOTE — Progress Notes (Signed)
Subjective  CC:  Chief Complaint  Patient presents with  . Hypertension  . Back Pain    lower back  . Wrist Pain    left wrist - thumb  . Rash    all over body    HPI: Kathleen Morse is a 69 y.o. female who presents to the office today to address the problems listed above in the chief complaint.  Hypertension f/u: Control is good . Pt reports she is doing well. taking medications as instructed, no medication side effects noted, no TIAs, no chest pain on exertion, no dyspnea on exertion, no swelling of ankles. She denies adverse effects from his BP medications. Compliance with medication is good.   Elevated wbc: Last labs show white blood cell count is 13.8.  Have not received old records to compare.  She reports that 10 to 15 years ago she was told about an elevated white blood cell count.  She cannot remember the details.  It sounds like it has been worked up and is chronic.  Has cervical osteoarthritis and lumbar osteoarthritis: Chronic back pain.  Neck pain is active.  No radicular symptoms.  No arm or leg weakness.  Uses over-the-counter Tylenol.  No recent injuries.  She reports a lump in her right neck: Has been diagnosed with a lipoma.  She wonders if this contributes to her neck pain.  No redness, increase in size on recent trauma.  Complains of intermittent rashes described as rash itching flaking areas on bilateral lower extremities and elbows.  Has been on and off intermittently over the last 1 to 2 years.  She takes long hot showers to try to alleviate her symptoms.  No hives or vesicles.  No systemic symptoms.  She uses Dove or oil of Olay for her soap.  No systemic symptoms.  Over-the-counter steroid cream resolves the rash.  She has no rash at this time.  Complains of left thumb and wrist pain.  Pain limits her use of her thumb.  She did use her computer a lot in the past but is limited this to her right hand only due to the pain.  No recent trauma.  No weakness in  extremities.  Pain is elicited with thumb movement.  Assessment  1. Essential hypertension   2. Leukocytosis, unspecified type   3. Chronic midline low back pain without sciatica   4. 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 Intrinsic eczema   6. Tenosynovitis, de Quervain   7. Spondylosis of cervical region without myelopathy or radiculopathy   8. Lipoma of neck      Plan    Hypertension f/u: BP control is well controlled.  Continue current medications  Osteoarthritis f/u: Neck and lumbar OA noted by x-rays.  No red flag symptoms.  Voltaren as needed.  Education given.  De Quervain's tenosynovitis right, likely due to overuse.  Education given.  See handout.  Icing and Voltaren x2 weeks.  Follow-up if not improving.  She can use an over-the-counter thumb splint as well as needed.  Lipoma: Reassured, benign.  Unlikely to be contributing to her neck pain.  Leukocytosis, suspect chronic.  Will request old records again.  Recheck today.  Eczema: Triamcinolone cream as needed.  Discussed other supportive measures including lukewarm baths, Aveeno baths, limited bathing and decreasing stressors.  Education regarding management of these chronic disease states was given. Management strategies discussed on successive visits include dietary and exercise recommendations, goals of achieving and maintaining  IBW, and lifestyle modifications aiming for adequate sleep and minimizing stressors.   Follow up: 6 months for complete physical and blood pressure follow-up  Orders Placed This Encounter  Procedures  . CBC with Differential/Platelet   Meds ordered this encounter  Medications  . diclofenac (VOLTAREN) 75 MG EC tablet    Sig: Take 1 tablet (75 mg total) by mouth 2 (two) times daily.    Dispense:  30 tablet    Refill:  2  . triamcinolone cream (KENALOG) 0.1 %    Sig: Apply 1 application topically 2 (two) times daily. For 2 weeks, then as needed     Dispense:  45 g    Refill:  0      BP Readings from Last 3 Encounters:  04/09/20 (!) 130/82  09/30/19 (!) 156/88   Wt Readings from Last 3 Encounters:  04/09/20 153 lb 6.4 oz (69.6 kg)  09/30/19 154 lb 6.4 oz (70 kg)    Lab Results  Component Value Date   CHOL 200 (H) 09/30/2019   Lab Results  Component Value Date   HDL 52 09/30/2019   Lab Results  Component Value Date   LDLCALC 126 (H) 09/30/2019   Lab Results  Component Value Date   TRIG 111 09/30/2019   Lab Results  Component Value Date   CHOLHDL 3.8 09/30/2019   No results found for: LDLDIRECT Lab Results  Component Value Date   CREATININE 0.88 09/30/2019   BUN 12 09/30/2019   NA 141 09/30/2019   K 3.8 09/30/2019   CL 101 09/30/2019   CO2 27 09/30/2019    The 10-year ASCVD risk score Mikey Bussing DC Jr., et al., 2013) is: 11.1%   Values used to calculate the score:     Age: 80 years     Sex: Female     Is Non-Hispanic African American: Yes     Diabetic: No     Tobacco smoker: No     Systolic Blood Pressure: 423 mmHg     Is BP treated: Yes     HDL Cholesterol: 52 mg/dL     Total Cholesterol: 200 mg/dL  Lab Results  Component Value Date   WBC 13.8 (H) 09/30/2019   HGB 13.1 09/30/2019   HCT 39.4 09/30/2019   MCV 88.9 09/30/2019   PLT 382 09/30/2019    I reviewed the patients updated PMH, FH, and SocHx.    Patient Active Problem List   Diagnosis Date Noted  . Intrinsic eczema 04/09/2020  . Spondylosis of cervical region without myelopathy or radiculopathy 04/09/2020  . Lipoma of neck 04/09/2020  . Osteopenia after menopause 12/27/2019  . Glaucoma 09/30/2019  . Chronic midline low back pain without sciatica 09/30/2019  . 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 Obesity (BMI 30-39.9) 09/30/2019  . Moderate persistent asthma 08/25/2019  . Essential hypertension 08/25/2019  . Mixed hyperlipidemia 08/25/2019    Allergies: Penicillins  Social  History: Patient  reports that she has quit smoking. She has never used smokeless tobacco. She reports current alcohol use. She reports that she does not use drugs.  Current Meds  Medication Sig  . albuterol (VENTOLIN HFA) 108 (90 Base) MCG/ACT inhaler Inhale 1-2 puffs into the lungs every 6 (six) hours as needed for wheezing or shortness of breath.  Marland Kitchen amLODipine (NORVASC) 10 MG tablet Take 1 tablet (10 mg total) by mouth daily.  Marland Kitchen b complex vitamins capsule Take 1 capsule by mouth daily.  Marland Kitchen co-enzyme Q-10  30 MG capsule Take 30 mg by mouth 3 (three) times daily.  Marland Kitchen latanoprost (XALATAN) 0.005 % ophthalmic solution 1 drop at bedtime.  . montelukast (SINGULAIR) 10 MG tablet Take 10 mg by mouth at bedtime.  . pravastatin (PRAVACHOL) 80 MG tablet Take 80 mg by mouth daily.  Marland Kitchen triamcinolone cream (KENALOG) 0.1 % Apply 1 application topically 2 (two) times daily. For 2 weeks, then as needed  . [DISCONTINUED] triamcinolone cream (KENALOG) 0.1 % Apply 1 application topically 2 (two) times daily. For 2 weeks, then as needed    Review of Systems: Cardiovascular: negative for chest pain, palpitations, leg swelling, orthopnea Respiratory: negative for SOB, wheezing or persistent cough Gastrointestinal: negative for abdominal pain Genitourinary: negative for dysuria or gross hematuria  Objective  Vitals: BP (!) 130/82   Pulse 88   Temp 97.7 F (36.5 C) (Temporal)   Resp 15   Ht 4\' 10"  (1.473 m)   Wt 153 lb 6.4 oz (69.6 kg)   SpO2 98%   BMI 32.06 kg/m  General: no acute distress  Psych:  Alert and oriented, normal mood and affect HEENT:  Normocephalic, atraumatic, supple neck, right supraclavicular 3 to 4 cm nontender mobile mass, no midline tenderness Cardiovascular:  RRR without murmur. no edema Respiratory:  Good breath sounds bilaterally, CTAB with normal respiratory effort Skin:  Warm, no rashes Neurologic:   Mental status is normal Left hand: Positive Finkelstein's test, tenderness  over tendon, normal grip, normal-appearing joints.  Commons side effects, risks, benefits, and alternatives for medications and treatment plan prescribed today were discussed, and the patient expressed understanding of the given instructions. Patient is instructed to call or message via MyChart if he/she has any questions or concerns regarding our treatment plan. No barriers to understanding were identified. We discussed Red Flag symptoms and signs in detail. Patient expressed understanding regarding what to do in case of urgent or emergency type symptoms.   Medication list was reconciled, printed and provided to the patient in AVS. Patient instructions and summary information was reviewed with the patient as documented in the AVS. This note was prepared with assistance of Dragon voice recognition software. Occasional wrong-word or sound-a-like substitutions may have occurred due to the inherent limitations of voice recognition software  This visit occurred during the SARS-CoV-2 public health emergency.  Safety protocols were in place, including screening questions prior to the visit, additional usage of staff PPE, and extensive cleaning of exam room while observing appropriate contact time as indicated for disinfecting solutions.

## 2020-04-09 NOTE — Progress Notes (Signed)
Please call patient: I have reviewed his/her lab results. Blood counts are mildly improved. WBC remains just above normal.  Need old records to review; pt reports this is chronic. Thanks.

## 2020-05-08 ENCOUNTER — Other Ambulatory Visit: Payer: Self-pay | Admitting: Family Medicine

## 2020-05-22 ENCOUNTER — Encounter: Payer: Self-pay | Admitting: Family Medicine

## 2020-05-22 ENCOUNTER — Ambulatory Visit (INDEPENDENT_AMBULATORY_CARE_PROVIDER_SITE_OTHER): Payer: Medicare HMO

## 2020-05-22 ENCOUNTER — Other Ambulatory Visit: Payer: Self-pay

## 2020-05-22 DIAGNOSIS — Z23 Encounter for immunization: Secondary | ICD-10-CM

## 2020-06-06 DIAGNOSIS — R6889 Other general symptoms and signs: Secondary | ICD-10-CM | POA: Diagnosis not present

## 2020-06-06 DIAGNOSIS — H401134 Primary open-angle glaucoma, bilateral, indeterminate stage: Secondary | ICD-10-CM | POA: Diagnosis not present

## 2020-06-25 ENCOUNTER — Telehealth: Payer: Self-pay

## 2020-06-25 ENCOUNTER — Other Ambulatory Visit: Payer: Self-pay

## 2020-06-25 MED ORDER — DICLOFENAC SODIUM 1 % EX GEL: 2.0000 g | Freq: Four times a day (QID) | CUTANEOUS | 5 refills | Status: AC | PRN

## 2020-06-25 NOTE — Telephone Encounter (Signed)
Pt states that the medication that was prescribed for her hand pain isn't " agreeing" with her, as far as the side effects. She is wanting to know if she can do something else

## 2020-06-25 NOTE — Telephone Encounter (Signed)
Please advise 

## 2020-06-25 NOTE — Telephone Encounter (Signed)
Please call to clarify: is she talking about the diclofenac and what is the specific side effect?  Can change to meloxicam 7.5mg  - 1-2 tabs daily if needed OR topical voltaren gel.   thanks

## 2020-06-25 NOTE — Addendum Note (Signed)
Addended by: Billey Chang on: 06/25/2020 11:08 AM   Modules accepted: Orders

## 2020-06-25 NOTE — Telephone Encounter (Signed)
Patient prefers Voltaren gel, requesting a script. Please advise directions for script

## 2020-07-05 ENCOUNTER — Other Ambulatory Visit: Payer: Self-pay | Admitting: Family Medicine

## 2020-07-06 ENCOUNTER — Encounter: Payer: Self-pay | Admitting: Family Medicine

## 2020-08-30 ENCOUNTER — Other Ambulatory Visit: Payer: Self-pay | Admitting: Family Medicine

## 2020-09-06 ENCOUNTER — Other Ambulatory Visit: Payer: Self-pay

## 2020-09-06 ENCOUNTER — Ambulatory Visit (INDEPENDENT_AMBULATORY_CARE_PROVIDER_SITE_OTHER): Payer: Medicare HMO | Admitting: Family Medicine

## 2020-09-06 ENCOUNTER — Encounter: Payer: Self-pay | Admitting: Family Medicine

## 2020-09-06 VITALS — BP 140/88 | HR 93 | Temp 97.9°F | Ht <= 58 in | Wt 155.5 lb

## 2020-09-06 DIAGNOSIS — Z23 Encounter for immunization: Secondary | ICD-10-CM

## 2020-09-06 DIAGNOSIS — M654 Radial styloid tenosynovitis [de Quervain]: Secondary | ICD-10-CM

## 2020-09-06 DIAGNOSIS — M545 Low back pain, unspecified: Secondary | ICD-10-CM | POA: Diagnosis not present

## 2020-09-06 DIAGNOSIS — G8929 Other chronic pain: Secondary | ICD-10-CM | POA: Diagnosis not present

## 2020-09-06 DIAGNOSIS — M47816 Spondylosis without myelopathy or radiculopathy, lumbar region: Secondary | ICD-10-CM | POA: Diagnosis not present

## 2020-09-06 NOTE — Addendum Note (Signed)
Addended by: Jimmye Norman on: 09/06/2020 09:24 AM   Modules accepted: Orders

## 2020-09-06 NOTE — Patient Instructions (Addendum)
Please return for your annual complete physical; please come fasting. 1-3 months.   If your wrist pain persists, return and I will get xrays.  Today you were given your prevnar vaccination. This protects your from pneumonia. You will not need anymore pneumonia vaccines.   If you have any questions or concerns, please don't hesitate to send me a message via MyChart or call the office at [phone]. Thank you for visiting with [place] today! It's our pleasure caring for you.  You had a steroid injection today.   Things to be aware of after this injection are listed below:  You may experience no significant improvement or even a slight worsening in your symptoms during the first 24 to 48 hours.  After that we expect your symptoms to improve gradually over the next 2 weeks for the medicine to have its maximal effect.  You should continue to have improvement out to 6 weeks after your injection.  I recommend icing the site of the injection for 20 minutes  1-2 times the day of your injection  You may shower but no swimming, tub bath or Jacuzzi for 24 hours.  If your bandage falls off this does not need to be replaced.  It is appropriate to remove the bandage after 4 hours.  You may resume light activities as tolerated.     POSSIBLE PROCEDURE SIDE EFFECTS: The side effects of the injection are usually fairly minimal however if you may experience some of the following side effects that are usually self-limited and will is off on their own.  If you are concerned please feel free to call the office with questions:             Increased numbness or tingling             Nausea or vomiting             Swelling or bruising at the injection site    Please call our office if if you experience any of the following symptoms over the next week as these can be signs of infection:              Fever greater than 100.79F             Significant swelling at the injection site             Significant redness or  drainage from the injection site

## 2020-09-06 NOTE — Progress Notes (Addendum)
Subjective  CC:  Chief Complaint  Patient presents with  . Wrist Pain    Pt c/o bilateral wrist pain and radiates into thumbs. Using Voltaren gel with some relief.  . Tailbone Pain    Pt c/o pain at tail bone for long time, taking Tylenol 1000 mg and using Voltaren gel with some relief.    HPI: Kathleen Morse is a 69 y.o. female who presents to the office today to address the problems listed above in the chief complaint.  Patient returns due to left wrist pain.  See note from August.  At that time I thought she had de Quervain's tenosynovitis.  She is used ice, a thumb splint and Voltaren gel with only minimal relief.  She notices the pain mostly when she is using her cell phone.  She holds it with her thumb extended.  She does not do any other repetitive motions with her left hand.  She denies redness or swelling.  No weakness in the extremity.  No numbness or tingling.  She has chronic low back pain with osteoarthritis and she reports a history of herniated disc.  This remains intermittently active.  Pain is mild.  Managed with Tylenol.  No new symptoms.  No leg weakness or radicular symptoms.  She is no longer taking gabapentin but she feels it did not help her. Assessment  1. Tenosynovitis, de Quervain   2. Chronic midline low back pain without sciatica   3. 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  Performed steroid injection today to see if this will be helpful.  Ice and rest.  If not improving recommend Tylenol and will get x-rays at next visit.  Patient understands and agrees with care plan.  Chronic low back pain is stable.  Tylenol as needed.  No red flag symptoms noted.  Prevnar immunization updated today.  Follow up: Return for complete physical.  Visit date not found  No orders of the defined types were placed in this encounter.  No orders  of the defined types were placed in this encounter.     I reviewed the patients updated PMH, FH, and SocHx.    Patient Active Problem List   Diagnosis Date Noted  . Intrinsic eczema 04/09/2020  . Spondylosis of cervical region without myelopathy or radiculopathy 04/09/2020  . Lipoma of neck 04/09/2020  . Osteopenia after menopause 12/27/2019  . Glaucoma 09/30/2019  . Chronic midline low back pain without sciatica 09/30/2019  . 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 Obesity (BMI 30-39.9) 09/30/2019  . Moderate persistent asthma 08/25/2019  . Essential hypertension 08/25/2019  . Mixed hyperlipidemia 08/25/2019   Current Meds  Medication Sig  . albuterol (VENTOLIN HFA) 108 (90 Base) MCG/ACT inhaler Inhale 1-2 puffs into the lungs every 6 (six) hours as needed for wheezing or shortness of breath.  Marland Kitchen amLODipine (NORVASC) 10 MG tablet TAKE 1 TABLET EVERY DAY  . b complex vitamins capsule Take 1 capsule by mouth daily.  Marland Kitchen co-enzyme Q-10 30 MG capsule Take 30 mg by mouth 3 (three) times daily.  . diclofenac (VOLTAREN) 75 MG EC tablet TAKE 1 TABLET TWICE DAILY  . diclofenac Sodium (VOLTAREN) 1 % GEL Apply 2 g topically 4 (four) times daily as needed.  . latanoprost (XALATAN) 0.005 % ophthalmic solution 1 drop at bedtime.  . montelukast (SINGULAIR) 10 MG  tablet Take 10 mg by mouth at bedtime.  . pravastatin (PRAVACHOL) 80 MG tablet Take 80 mg by mouth daily.  Marland Kitchen triamcinolone cream (KENALOG) 0.1 % APPLY 1 APPLICATION TOPICALLY 2 (TWO) TIMES DAILY FOR 2 WEEKS, THEN AS NEEDED    Allergies: Patient is allergic to penicillins. Family History: Patient family history includes Arthritis in her mother; Birth defects in her mother; Cancer in her sister; Depression in her father; Diabetes in her mother; Heart attack in her maternal grandmother; Stroke in her maternal grandfather. Social History:  Patient  reports that she has quit smoking. She has never  used smokeless tobacco. She reports current alcohol use. She reports that she does not use drugs.  Review of Systems: Constitutional: Negative for fever malaise or anorexia Cardiovascular: negative for chest pain Respiratory: negative for SOB or persistent cough Gastrointestinal: negative for abdominal pain  Objective  Vitals: BP 140/88 (BP Location: Left Arm, Patient Position: Sitting, Cuff Size: Normal)   Pulse 93   Temp 97.9 F (36.6 C) (Temporal)   Ht 4\' 10"  (1.473 m)   Wt 155 lb 8 oz (70.5 kg)   SpO2 95%   BMI 32.50 kg/m  General: no acute distress , A&Ox3 Left wrist: Tender over styloid process, pain with resisted stanchion of thumb.  Negative Finkelstein's today.  Normal grip.  No tenderness over first MCP joint Normal gait  Dequervain's tenosynovitis Steroid Injection Procedure Note  Pre-operative Diagnosis: De Quervain's Tenosynovitis, left  Post-operative Diagnosis: same  Indications: pain and treatment   Anesthesia:cold spray  Procedure Details   Verbal consent was obtained for the procedure. Universal time out done. The area between the affected extensor tendons of the thumb were palpated and marked.  The skin prepped with alcohol and cold spray was used for anesthesia.  A 1 1/2" 25ga needle was advanced into the skin over the affected area of the extensor tendons and  0.25 ml of triamcinolone (KENALOG) 40mg /ml with 0.65ml of lidocaine 1% was injected.   Complications:  None; patient tolerated the procedure well.    Commons side effects, risks, benefits, and alternatives for medications and treatment plan prescribed today were discussed, and the patient expressed understanding of the given instructions. Patient is instructed to call or message via MyChart if he/she has any questions or concerns regarding our treatment plan. No barriers to understanding were identified. We discussed Red Flag symptoms and signs in detail. Patient expressed understanding regarding  what to do in case of urgent or emergency type symptoms.   Medication list was reconciled, printed and provided to the patient in AVS. Patient instructions and summary information was reviewed with the patient as documented in the AVS. This note was prepared with assistance of Dragon voice recognition software. Occasional wrong-word or sound-a-like substitutions may have occurred due to the inherent limitations of voice recognition software  This visit occurred during the SARS-CoV-2 public health emergency.  Safety protocols were in place, including screening questions prior to the visit, additional usage of staff PPE, and extensive cleaning of exam room while observing appropriate contact time as indicated for disinfecting solutions.

## 2020-09-27 MED ORDER — TRIAMCINOLONE ACETONIDE 40 MG/ML IJ SUSP
40.0000 mg | Freq: Once | INTRAMUSCULAR | Status: AC
  Administered 2020-09-27: 40 mg via INTRAMUSCULAR

## 2020-09-27 NOTE — Addendum Note (Signed)
Addended by: Jodell Cipro on: 09/27/2020 09:46 AM   Modules accepted: Orders

## 2020-10-05 DIAGNOSIS — H401134 Primary open-angle glaucoma, bilateral, indeterminate stage: Secondary | ICD-10-CM | POA: Diagnosis not present

## 2020-10-05 DIAGNOSIS — R6889 Other general symptoms and signs: Secondary | ICD-10-CM | POA: Diagnosis not present

## 2020-10-05 DIAGNOSIS — Z0100 Encounter for examination of eyes and vision without abnormal findings: Secondary | ICD-10-CM | POA: Diagnosis not present

## 2020-10-29 ENCOUNTER — Other Ambulatory Visit: Payer: Self-pay | Admitting: Family Medicine

## 2020-10-30 ENCOUNTER — Encounter: Payer: Self-pay | Admitting: Family Medicine

## 2020-10-30 ENCOUNTER — Ambulatory Visit (INDEPENDENT_AMBULATORY_CARE_PROVIDER_SITE_OTHER): Payer: Medicare HMO | Admitting: Family Medicine

## 2020-10-30 ENCOUNTER — Ambulatory Visit (INDEPENDENT_AMBULATORY_CARE_PROVIDER_SITE_OTHER): Payer: Medicare HMO

## 2020-10-30 ENCOUNTER — Other Ambulatory Visit: Payer: Self-pay

## 2020-10-30 VITALS — BP 132/84 | HR 109 | Temp 97.2°F | Wt 151.0 lb

## 2020-10-30 DIAGNOSIS — G8929 Other chronic pain: Secondary | ICD-10-CM

## 2020-10-30 DIAGNOSIS — M545 Low back pain, unspecified: Secondary | ICD-10-CM

## 2020-10-30 DIAGNOSIS — Z7185 Encounter for immunization safety counseling: Secondary | ICD-10-CM | POA: Diagnosis not present

## 2020-10-30 DIAGNOSIS — I10 Essential (primary) hypertension: Secondary | ICD-10-CM | POA: Diagnosis not present

## 2020-10-30 DIAGNOSIS — R142 Eructation: Secondary | ICD-10-CM

## 2020-10-30 DIAGNOSIS — E782 Mixed hyperlipidemia: Secondary | ICD-10-CM

## 2020-10-30 DIAGNOSIS — M654 Radial styloid tenosynovitis [de Quervain]: Secondary | ICD-10-CM | POA: Diagnosis not present

## 2020-10-30 DIAGNOSIS — M47816 Spondylosis without myelopathy or radiculopathy, lumbar region: Secondary | ICD-10-CM | POA: Diagnosis not present

## 2020-10-30 LAB — CBC WITH DIFFERENTIAL/PLATELET
Basophils Absolute: 0.1 10*3/uL (ref 0.0–0.1)
Basophils Relative: 0.6 % (ref 0.0–3.0)
Eosinophils Absolute: 0.3 10*3/uL (ref 0.0–0.7)
Eosinophils Relative: 2.9 % (ref 0.0–5.0)
HCT: 39.9 % (ref 36.0–46.0)
Hemoglobin: 13.4 g/dL (ref 12.0–15.0)
Lymphocytes Relative: 23.3 % (ref 12.0–46.0)
Lymphs Abs: 2.1 10*3/uL (ref 0.7–4.0)
MCHC: 33.5 g/dL (ref 30.0–36.0)
MCV: 89.9 fl (ref 78.0–100.0)
Monocytes Absolute: 0.8 10*3/uL (ref 0.1–1.0)
Monocytes Relative: 8.6 % (ref 3.0–12.0)
Neutro Abs: 5.9 10*3/uL (ref 1.4–7.7)
Neutrophils Relative %: 64.6 % (ref 43.0–77.0)
Platelets: 378 10*3/uL (ref 150.0–400.0)
RBC: 4.44 Mil/uL (ref 3.87–5.11)
RDW: 13.8 % (ref 11.5–15.5)
WBC: 9.1 10*3/uL (ref 4.0–10.5)

## 2020-10-30 LAB — LIPID PANEL
Cholesterol: 209 mg/dL — ABNORMAL HIGH (ref 0–200)
HDL: 64 mg/dL (ref >39.00)
LDL Cholesterol: 120 mg/dL — ABNORMAL HIGH (ref 0–99)
NonHDL: 144.91
Total CHOL/HDL Ratio: 3
Triglycerides: 125 mg/dL (ref 0.0–149.0)
VLDL: 25 mg/dL (ref 0.0–40.0)

## 2020-10-30 LAB — COMPREHENSIVE METABOLIC PANEL
ALT: 24 U/L (ref 0–35)
AST: 30 U/L (ref 0–37)
Albumin: 4.2 g/dL (ref 3.5–5.2)
Alkaline Phosphatase: 58 U/L (ref 39–117)
BUN: 10 mg/dL (ref 6–23)
CO2: 29 mEq/L (ref 19–32)
Calcium: 9.7 mg/dL (ref 8.4–10.5)
Chloride: 99 mEq/L (ref 96–112)
Creatinine, Ser: 0.77 mg/dL (ref 0.40–1.20)
GFR: 78.88 mL/min (ref >60.00)
Glucose, Bld: 106 mg/dL — ABNORMAL HIGH (ref 70–99)
Potassium: 3.6 mEq/L (ref 3.5–5.1)
Sodium: 138 mEq/L (ref 135–145)
Total Bilirubin: 0.5 mg/dL (ref 0.2–1.2)
Total Protein: 7.7 g/dL (ref 6.0–8.3)

## 2020-10-30 LAB — TSH: TSH: 3.39 u[IU]/mL (ref 0.35–4.50)

## 2020-10-30 MED ORDER — TRAMADOL HCL 50 MG PO TABS: 50.0000 mg | ORAL_TABLET | Freq: Every day | ORAL | 2 refills | Status: AC | PRN

## 2020-10-30 MED ORDER — SHINGRIX 50 MCG/0.5ML IM SUSR: 0.5000 mL | Freq: Once | INTRAMUSCULAR | 0 refills | Status: AC

## 2020-10-30 NOTE — Patient Instructions (Signed)
Please follow up as scheduled for your next visit with me: 03/07/2021   I will release your lab results to you on your MyChart account with further instructions. Please reply with any questions.   Please take the prescription for Shingrix to the pharmacy so they may administer the vaccinations. Your insurance will then cover the injections.   You may use diclofenac or tramadol as needed for your back pain.  Try simethicone or pepcid for your belching.   If you have any questions or concerns, please don't hesitate to send me a message via MyChart or call the office at [phone]. Thank you for visiting with [place] today! It's our pleasure caring for you.

## 2020-10-30 NOTE — Progress Notes (Signed)
Subjective  CC:  Chief Complaint  Patient presents with  . Gastroesophageal Reflux    Belching a lot  . Back Pain    Bilateral lower back pain    HPI: Kathleen Morse is a 70 y.o. female who presents to the office today to address the problems listed above in the chief complaint.  Hypertension f/u: Control is good . Pt reports she is doing well. taking medications as instructed, no medication side effects noted, no TIAs, no chest pain on exertion, no dyspnea on exertion, no swelling of ankles. She denies adverse effects from his BP medications. Compliance with medication is good. Due for lab work. nonfasting  HLD on statin. Tolerating well  Complaints of low back pain.  Has chronic low back pain due to osteoarthritis.  Flared recently.  Has pain with standing and bending forward.  Limiting her ability to do her chores at home.  She denies radicular symptoms.  No bowel or bladder symptoms.  She is used Voltaren with some relief.  From old record review, has been on tramadol in the past.  Complains of belching: No pain, no reflux, no indigestion.  Cannot related to any certain types of food.  Has not taken medications for it.  Fortunately left thumb pain has resolved with Voltaren ice and brace.  Assessment  1. Essential hypertension   2. Mixed hyperlipidemia   3. Chronic midline low back pain without sciatica   4. 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 Eructation   6. Tenosynovitis, de Quervain   7. Vaccine counseling      Plan    Hypertension f/u: BP control is well controlled.  Check renal function electrolytes  Hyperlipidemia f/u: Check lipids and LFTs  Low back pain: Check x-rays.  Recommend heat, massage, stretching, Voltaren as needed and trial of tramadol if needed.  Further evaluation and treatment pending results of x-rays.  No red flag symptoms present  De Quervain's: Resolved  Eructation: Discussed management  including minimizing air swallowing while eating, trial of simethicone and/or Pepcid.  Reassured.  Shingrix counseling given.  Prescription given.   Education regarding management of these chronic disease states was given. Management strategies discussed on successive visits include dietary and exercise recommendations, goals of achieving and maintaining IBW, and lifestyle modifications aiming for adequate sleep and minimizing stressors.   Follow up: 6 months for follow-up hypertension  Orders Placed This Encounter  Procedures  . DG Lumbar Spine Complete  . CBC with Differential/Platelet  . Comprehensive metabolic panel  . Lipid panel  . TSH   Meds ordered this encounter  Medications  . Zoster Vaccine Adjuvanted [facility]) injection    Sig: Inject 0.5 mLs into the muscle once for 1 dose. Please give 2nd dose 2-6 months after first dose    Dispense:  2 each    Refill:  0  . traMADol (ULTRAM) 50 MG tablet    Sig: Take 1 tablet (50 mg total) by mouth daily as needed for up to 5 days for moderate pain (low back pain).    Dispense:  20 tablet    Refill:  2      BP Readings from Last 3 Encounters:  10/30/20 132/84  09/06/20 140/88  04/09/20 (!) 130/82   Wt Readings from Last 3 Encounters:  10/30/20 151 lb (68.5 kg)  09/06/20 155 lb 8 oz (70.5 kg)  04/09/20 153 lb 6.4 oz (69.6 kg)    Lab Results  Component Value  Date   CHOL 200 (H) 09/30/2019   Lab Results  Component Value Date   HDL 52 09/30/2019   Lab Results  Component Value Date   LDLCALC 126 (H) 09/30/2019   Lab Results  Component Value Date   TRIG 111 09/30/2019   Lab Results  Component Value Date   CHOLHDL 3.8 09/30/2019   No results found for: LDLDIRECT Lab Results  Component Value Date   CREATININE 0.88 09/30/2019   BUN 12 09/30/2019   NA 141 09/30/2019   K 3.8 09/30/2019   CL 101 09/30/2019   CO2 27 09/30/2019    The 10-year ASCVD risk score Mikey Bussing DC Jr., et al., 2013) is: 12%   Values used  to calculate the score:     Age: 12 years     Sex: Female     Is Non-Hispanic African American: Yes     Diabetic: No     Tobacco smoker: No     Systolic Blood Pressure: 614 mmHg     Is BP treated: Yes     HDL Cholesterol: 52 mg/dL     Total Cholesterol: 200 mg/dL  I reviewed the patients updated PMH, FH, and SocHx.    Patient Active Problem List   Diagnosis Date Noted  . Intrinsic eczema 04/09/2020  . Spondylosis of cervical region without myelopathy or radiculopathy 04/09/2020  . Lipoma of neck 04/09/2020  . Osteopenia after menopause 12/27/2019  . Glaucoma 09/30/2019  . Chronic midline low back pain without sciatica 09/30/2019  . 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 Obesity (BMI 30-39.9) 09/30/2019  . Moderate persistent asthma 08/25/2019  . Essential hypertension 08/25/2019  . Mixed hyperlipidemia 08/25/2019    Allergies: Penicillins  Social History: Patient  reports that she has quit smoking. She has never used smokeless tobacco. She reports current alcohol use. She reports that she does not use drugs.  Current Meds  Medication Sig  . albuterol (VENTOLIN HFA) 108 (90 Base) MCG/ACT inhaler INHALE 1 TO 2 PUFFS INTO THE LUNGS EVERY 6 HOURS AS NEEDED FOR WHEEZING OR SHORTNESS OF BREATH.  Marland Kitchen amLODipine (NORVASC) 10 MG tablet TAKE 1 TABLET EVERY DAY  . b complex vitamins capsule Take 1 capsule by mouth daily.  Marland Kitchen co-enzyme Q-10 30 MG capsule Take 30 mg by mouth 3 (three) times daily.  . diclofenac (VOLTAREN) 75 MG EC tablet TAKE 1 TABLET TWICE DAILY  . diclofenac Sodium (VOLTAREN) 1 % GEL Apply 2 g topically 4 (four) times daily as needed.  . latanoprost (XALATAN) 0.005 % ophthalmic solution 1 drop at bedtime.  . montelukast (SINGULAIR) 10 MG tablet Take 10 mg by mouth at bedtime.  . pravastatin (PRAVACHOL) 80 MG tablet Take 80 mg by mouth daily.  . traMADol (ULTRAM) 50 MG tablet Take 1 tablet (50 mg total) by mouth daily as needed  for up to 5 days for moderate pain (low back pain).  . triamcinolone cream (KENALOG) 0.1 % APPLY 1 APPLICATION TOPICALLY 2 (TWO) TIMES DAILY FOR 2 WEEKS, THEN AS NEEDED  . Zoster Vaccine Adjuvanted [facility]) injection Inject 0.5 mLs into the muscle once for 1 dose. Please give 2nd dose 2-6 months after first dose    Review of Systems: Cardiovascular: negative for chest pain, palpitations, leg swelling, orthopnea Respiratory: negative for SOB, wheezing or persistent cough Gastrointestinal: negative for abdominal pain Genitourinary: negative for dysuria or gross hematuria  Objective  Vitals: BP 132/84 Comment: rechecked by me  Pulse (!) 109  Temp (!) 97.2 F (36.2 C) (Temporal)   Wt 151 lb (68.5 kg)   SpO2 97%   BMI 31.56 kg/m  General: no acute distress  Psych:  Alert and oriented, normal mood and affect HEENT:  Normocephalic, atraumatic, supple neck  Cardiovascular:  RRR without murmur. no edema Respiratory:  Good breath sounds bilaterally, CTAB with normal respiratory effort Skin:  Warm, no rashes Neurologic:   Mental status is normal  Commons side effects, risks, benefits, and alternatives for medications and treatment plan prescribed today were discussed, and the patient expressed understanding of the given instructions. Patient is instructed to call or message via MyChart if he/she has any questions or concerns regarding our treatment plan. No barriers to understanding were identified. We discussed Red Flag symptoms and signs in detail. Patient expressed understanding regarding what to do in case of urgent or emergency type symptoms.   Medication list was reconciled, printed and provided to the patient in AVS. Patient instructions and summary information was reviewed with the patient as documented in the AVS. This note was prepared with assistance of Dragon voice recognition software. Occasional wrong-word or sound-a-like substitutions may have occurred due to the inherent  limitations of voice recognition software  This visit occurred during the SARS-CoV-2 public health emergency.  Safety protocols were in place, including screening questions prior to the visit, additional usage of staff PPE, and extensive cleaning of exam room while observing appropriate contact time as indicated for disinfecting solutions.

## 2020-11-08 NOTE — Progress Notes (Signed)
Please call patient: I have reviewed his/her lab results. Labs are all stable. Xray of back shows arthritis. PT could be tried if pain persists. Can refer if she is interested.

## 2020-11-09 ENCOUNTER — Other Ambulatory Visit: Payer: Self-pay

## 2020-11-09 DIAGNOSIS — M1990 Unspecified osteoarthritis, unspecified site: Secondary | ICD-10-CM

## 2020-11-13 ENCOUNTER — Encounter: Payer: Self-pay | Admitting: Physical Therapy

## 2020-11-13 ENCOUNTER — Ambulatory Visit (INDEPENDENT_AMBULATORY_CARE_PROVIDER_SITE_OTHER): Payer: Medicare HMO | Admitting: Physical Therapy

## 2020-11-13 ENCOUNTER — Other Ambulatory Visit: Payer: Self-pay

## 2020-11-13 DIAGNOSIS — R262 Difficulty in walking, not elsewhere classified: Secondary | ICD-10-CM | POA: Diagnosis not present

## 2020-11-13 DIAGNOSIS — M545 Low back pain, unspecified: Secondary | ICD-10-CM

## 2020-11-13 DIAGNOSIS — M6281 Muscle weakness (generalized): Secondary | ICD-10-CM

## 2020-11-13 NOTE — Patient Instructions (Signed)
Access Code: [email or web address] URL: [email or web address] Date: 11/13/2020 Prepared by: Daleen Bo  Exercises Supine Lower Trunk Rotation - 2 x daily - 7 x weekly - 2 sets - 10 reps - 3 hold Supine Single Knee to Chest Stretch - 2 x daily - 7 x weekly - 1 sets - 10 reps - 10 hold Seated Pelvic Tilt - 2 x daily - 7 x weekly - 1 sets - 20 reps

## 2020-11-13 NOTE — Therapy (Addendum)
[facility] [address] [place], [place], [address] Phone: [phone]   Fax:  [phone]  Physical Therapy Evaluation/D/C  PHYSICAL THERAPY DISCHARGE SUMMARY  Visits from Start of Care: 1  Plan: Patient agrees to discharge.  Patient goals were not met. Patient is being discharged due to not returning to therapy.        Patient Details  Name: Kathleen Morse MRN: [id number] Date of Birth: [birth year] Referring Provider (PT): Dr. Jonni Sanger   Encounter Date: 11/13/2020   PT End of Session - 11/13/20 1410     Visit Number 1    Number of Visits 13    Date for PT Re-Evaluation 12/13/20    Authorization Type [employer]    PT Start Time 1300    PT Stop Time 1350    PT Time Calculation (min) 50 min    Activity Tolerance Patient tolerated treatment well;Patient limited by pain    Behavior During Therapy [facility] for tasks assessed/performed             Past Medical History:  Diagnosis Date   Asthma    Glaucoma 09/30/2019   History of blood transfusion 08/25/2019   Hyperlipidemia    Hypertension    Obesity (BMI 30-39.9) 09/30/2019   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 Recheck 2-3 years.     Past Surgical History:  Procedure Laterality Date   ABDOMINAL HYSTERECTOMY  1983   CESAREAN SECTION  1971    There were no vitals filed for this visit.    Subjective Assessment - 11/13/20 1306     Subjective Pt states that she is here for PT due to her back. Dressing, bathing, cooking hurts her back. She states she has pain across her low back that does not radiate. She states the back pain has been happening for over 5 years. 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 Pt reports NT once in the past into the LE with intermittent occurence recently. She states that pushing a  shopping cart, walking up and down hill, and stairs cause. She reports she might not be as strong as she used to be. Pt denies red flags for cancer. Pt denies systemic symptoms. Pt denies pain with coughing, laughing, or bearing down to use bathroom. She states she has trouble sleeping but that is likely due to her mind racing. She states that pain will get worse through the day and with increased activity. Pt states she does not currently exercise but she does do some walking every once in a while. She has more NT into her L hand than the hip. She states she got a shot for the hand from the MD last visit for her wrist. Wrist pain started happening at the beginning of last year. About 6-7 months. She states the wrist pain comes on all of sudden with no agg factors.    Limitations Sitting;Reading;Lifting;Standing;Walking;House hold activities    How long can you sit comfortably? as long as she would like    How long can you stand comfortably? 20 mins    How long can you walk comfortably? 30 mins    Patient Stated Goals Pt states she still dances and wants to return to acitivity without pain.    Currently in Pain? Yes    Pain Score 3  Pain Location Back    Pain Orientation Lower    Pain Descriptors / Indicators Nagging    Pain Type Chronic pain    Pain Onset More than a month ago    Pain Frequency Intermittent    Aggravating Factors  movement, bending, lifting, activity, walking    Pain Relieving Factors naproxen    Effect of Pain on Daily Activities difficulty with ambulation, mobility, and pain during tasks                [facility] PT Assessment - 11/13/20 0001       Assessment   Medical Diagnosis Low back pain    Referring Provider (PT) Dr. Jonni Sanger    Hand Dominance Right    Prior Therapy Low back pain      Precautions   Precautions None      Restrictions   Weight Bearing Restrictions No      Balance Screen   Has the patient fallen in the past 6 months No      [place] residence    Living Arrangements Spouse/significant other    Type of Home Apartment    Home Access Stairs to enter      Prior Function   Level of Independence Independent      Cognition   Overall Cognitive Status Within Functional Limits for tasks assessed      Sensation   Light Touch Appears Intact      Functional Tests   Functional tests Squat;Sit to Stand      Sit to Stand   Comments Requires UE support from low table surface      Posture/Postural Control   Posture/Postural Control Postural limitations    Postural Limitations Decreased lumbar lordosis;Weight shift right      ROM / Strength   AROM / PROM / Strength AROM;PROM;Strength      AROM   Overall AROM  Deficits    Overall AROM Comments limited in L/S ROM in all planes. most limitation with R rotation and extension due to L sided pain      PROM   Overall PROM  Deficits    Overall PROM Comments mod limitation in hip flexion, ER, and IR      Strength   Overall Strength Deficits    Overall Strength Comments 4-/5 in bilat hip flexion, ABD, and ADD, bilat knee extension 4+/5, knee extension 4+/5      Flexibility   Soft Tissue Assessment /Muscle Length yes    Hamstrings 90/90 >30 deg deficits    Quadriceps positive Ely's    Piriformis mod limitation R>>L      Palpation   Spinal mobility L/S extension restriction with prone PA    Palpation comment TTP and hypertonicity of bilateral paraspinals and QL      Special Tests    Special Tests Lumbar;Sacrolliac Tests    Lumbar Tests FABER test;Straight Leg Raise    Sacroiliac Tests  Sacral Compression      FABER test   findings Negative      Straight Leg Raise   Findings Negative      Sacral Compression   Findings Negative      Ambulation/Gait   Gait Pattern Step-through pattern;Lateral trunk lean to right    Stairs Yes    Stair Management Technique Alternating pattern;One rail Left;One rail Right      Balance   Balance  Assessed Yes      Static Standing Balance  Static Standing - Balance Support No upper extremity supported    Static Standing Balance -  Activities  Single Leg Stance - Right Leg;Single Leg Stance - Left Leg    Static Standing - Comment/# of Minutes <10s with SLS bilat                        Objective measurements completed on examination: See above findings.       [facility] Adult PT Treatment/Exercise - 11/13/20 0001       Exercises   Exercises Lumbar      Lumbar Exercises: Stretches   Lower Trunk Rotation Limitations 10x 3s hold    Other Lumbar Stretch Exercise SKTC stretch 10s 10x      Lumbar Exercises: Seated   Other Seated Lumbar Exercises seated pelvic tilting 20x      Manual Therapy   Manual Therapy Joint mobilization;Soft tissue mobilization    Soft tissue mobilization bilat L/S paraspinals                    PT Education - 11/13/20 1410     Education Details anatomy, prognosis, diagnosis, daily movement variation, increasing physical activity, posture, muscle firing, HEP, POC    Person(s) Educated Patient    Methods Explanation;Demonstration;Handout;Tactile cues;Verbal cues    Comprehension Verbalized understanding;Returned demonstration              PT Short Term Goals - 11/13/20 1418       PT SHORT TERM GOAL #1   Title Pt will become independent with HEP in order to demonstrate synthesis of PT education.    Time 2    Period Weeks    Status New      PT SHORT TERM GOAL #2   Title Pt will be able to demonstrate 5XSTS without UE support in order to demonstrate functional improve in LE strength and balance.    Time 3    Period Weeks    Status New               PT Long Term Goals - 11/13/20 1419       PT LONG TERM GOAL #1   Title Pt will be able to lift and carry >25lbs without pain in order to demonstrate functional improvement in ability to perform household duties.    Time 6    Period Weeks    Status New      PT  LONG TERM GOAL #2   Title Pt will report ability to perform dance at home without back pain in order to demonstrate improvement in lumbopelvic ROM and strength for exercise and recreation.    Time 6    Period Weeks    Status New                    Plan - 11/13/20 1412     Clinical Impression Statement Pt is a 70 y.o. female presenting to PT eval today with CC of LBP and L wrist/thumb pain. Due to time constraints, today's eval was limited to LBP. Wrist/thumb to be assessed at next session. Pt presents with decreased L/S and hip ROM, decreased lumbopelvic strength, and difficulty with transfers/mobility. Pt's s/s are consistent with non-specific chronic low back pain, likely due to history of sedentary lifestyle and OA in imaging. Pt's impairments limit her ability to fully participate and perform ADL, self care, household tasks, and community mobility. Pt would benefit from continued skilled therapy in  order to reach goals and maximize functional bilat LE strength and L/S ROM for community ambulation.    Personal Factors and Comorbidities Age;Behavior Pattern;Fitness;Time since onset of injury/illness/exacerbation    Examination-Activity Limitations Bathing;Dressing;Sit;Transfers;Bend;Lift;Squat;Carry;Stairs    Examination-Participation Restrictions Cleaning;Laundry;Community Activity;Meal Prep    Stability/Clinical Decision Making Stable/Uncomplicated    Clinical Decision Making Low    Rehab Potential Fair    PT Frequency 2x / week    PT Duration 6 weeks    PT Treatment/Interventions ADLs/Self Care Home Management;Biofeedback;Aquatic Therapy;Cryotherapy;Electrical Stimulation;Iontophoresis 41m/ml Dexamethasone;Moist Heat;Traction;Ultrasound;Gait training;Stair training;Functional mobility training;Therapeutic activities;Therapeutic exercise;Balance training;Neuromuscular re-education;Patient/family education;Manual techniques;Passive range of motion;Dry needling;Taping;Spinal  Manipulations;Joint Manipulations    PT Next Visit Plan review HEP, trial PPT again, STS, wrist/thumb    PT Home Exercise Plan [email or web address]   Consulted and Agree with Plan of Care Patient             Patient will benefit from skilled therapeutic intervention in order to improve the following deficits and impairments:  Decreased balance,Decreased mobility,Difficulty walking,Increased muscle spasms,Decreased range of motion,Pain,Postural dysfunction,Decreased strength,Decreased activity tolerance,Impaired flexibility  Visit Diagnosis: Lumbar pain  Muscle weakness (generalized)  Difficulty walking     Problem List Patient Active Problem List   Diagnosis Date Noted   Intrinsic eczema 04/09/2020   Spondylosis of cervical region without myelopathy or radiculopathy 04/09/2020   Lipoma of neck 04/09/2020   Osteopenia after menopause 12/27/2019   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 M19.90 Treatment diagnosis? (if different than referring diagnosis) M54.50, M62.81, R26.2 What was this (referring dx) caused by? '[]'  Surgery '[]'  Fall '[]'  Ongoing issue '[x]'  Arthritis '[]'  Other: ____________  Laterality: '[]'  Rt '[]'  Lt '[x]'  Both  Check all possible CPT codes:      '[x]'  97110 (Therapeutic Exercise)  '[]'  92507 (SLP Treatment)  '[x]'  97112 (Neuro Re-ed)   '[]'  92526 (Swallowing Treatment)   '[x]'  97116 (Gait Training)   '[]'  [id number](Cognitive Training, 1st 15  minutes) '[x]'  97140 (Manual Therapy)   '[]'  97130 (Cognitive Training, each add'l 15 minutes)  '[x]'  97530 (Therapeutic Activities)  '[]'  Other, List CPT Code ____________    '[x]'  922025(Self Care)       '[x]'  All codes above (97110 - 97535)  '[x]'  97012 (Mechanical Traction)  '[x]'  97014 (E-stim Unattended)  '[]'  97032 (E-stim manual)  '[x]'  97033 (Ionto)  '[x]'  97035 (Ultrasound)  '[x]'  97760 (Orthotic Fit) '[]'  [id number](Physical Performance Training) '[x]'  [id number](Aquatic Therapy) '[]'  97034 (Contrast Bath) '[]'  [id number](Paraffin) '[]'  97597 (Wound Care 1st 20 sq cm) '[]'  97598 (Wound Care each add'l 20 sq cm) '[]'  97016 (Vasopneumatic Device) '[]'  [id number](Comptroller '[]'  [id number](Prosthetic Training)

## 2020-11-15 ENCOUNTER — Encounter: Payer: Medicare HMO | Admitting: Physical Therapy

## 2020-11-20 ENCOUNTER — Encounter: Payer: Medicare HMO | Admitting: Physical Therapy

## 2020-11-22 ENCOUNTER — Encounter: Payer: Medicare HMO | Admitting: Physical Therapy

## 2020-11-27 ENCOUNTER — Encounter: Payer: Medicare HMO | Admitting: Physical Therapy

## 2020-11-29 ENCOUNTER — Encounter: Payer: Medicare HMO | Admitting: Physical Therapy

## 2020-12-21 DIAGNOSIS — R6889 Other general symptoms and signs: Secondary | ICD-10-CM | POA: Diagnosis not present

## 2021-01-24 ENCOUNTER — Ambulatory Visit (INDEPENDENT_AMBULATORY_CARE_PROVIDER_SITE_OTHER): Payer: Medicare HMO

## 2021-01-24 ENCOUNTER — Telehealth: Payer: Self-pay

## 2021-01-24 ENCOUNTER — Other Ambulatory Visit: Payer: Self-pay

## 2021-01-24 VITALS — BP 110/78 | HR 100 | Temp 98.0°F | Wt 153.8 lb

## 2021-01-24 DIAGNOSIS — Z1231 Encounter for screening mammogram for malignant neoplasm of breast: Secondary | ICD-10-CM | POA: Diagnosis not present

## 2021-01-24 DIAGNOSIS — Z0000 Encounter for general adult medical examination without abnormal findings: Secondary | ICD-10-CM

## 2021-01-24 NOTE — Telephone Encounter (Signed)
I have updated her immunization with correct date.

## 2021-01-24 NOTE — Telephone Encounter (Signed)
FYI  2nd covid booster - 12/21/20 [employer]

## 2021-01-24 NOTE — Patient Instructions (Signed)
Kathleen Morse , Thank you for taking time to come for your Medicare Wellness Visit. I appreciate your ongoing commitment to your health goals. Please review the following plan we discussed and let me know if I can assist you in the future.   Screening recommendations/referrals: Colonoscopy: Cologuard done 01/07/20  Mammogram: ordered 01/24/21 Bone Density: Done 12/26/19 Recommended yearly ophthalmology/optometry visit for glaucoma screening and checkup Recommended yearly dental visit for hygiene and checkup  Vaccinations: Influenza vaccine: Up to date Pneumococcal vaccine: Up to date Tdap vaccine: Up to date Shingles vaccine: Done 07/08/19  Covid-19:Completed 4/8, 5/3, & 07/29/20  Advanced directives: Please bring a copy of your health care power of attorney and living will to the office at your convenience.  Conditions/risks identified: lose weight   Next appointment: Follow up in one year for your annual wellness visit    Preventive Care 65 Years and Older, Female Preventive care refers to lifestyle choices and visits with your health care provider that can promote health and wellness. What does preventive care include?  A yearly physical exam. This is also called an annual well check.  Dental exams once or twice a year.  Routine eye exams. Ask your health care provider how often you should have your eyes checked.  Personal lifestyle choices, including:  Daily care of your teeth and gums.  Regular physical activity.  Eating a healthy diet.  Avoiding tobacco and drug use.  Limiting alcohol use.  Practicing safe sex.  Taking low-dose aspirin every day.  Taking vitamin and mineral supplements as recommended by your health care provider. What happens during an annual well check? The services and screenings done by your health care provider during your annual well check will depend on your age, overall health, lifestyle risk factors, and family history of  disease. Counseling  Your health care provider may ask you questions about your:  Alcohol use.  Tobacco use.  Drug use.  Emotional well-being.  Home and relationship well-being.  Sexual activity.  Eating habits.  History of falls.  Memory and ability to understand (cognition).  Work and work Statistician.  Reproductive health. Screening  You may have the following tests or measurements:  Height, weight, and BMI.  Blood pressure.  Lipid and cholesterol levels. These may be checked every 5 years, or more frequently if you are over 61 years old.  Skin check.  Lung cancer screening. You may have this screening every year starting at age 70 if you have a 30-pack-year history of smoking and currently smoke or have quit within the past 15 years.  Fecal occult blood test (FOBT) of the stool. You may have this test every year starting at age 46.  Flexible sigmoidoscopy or colonoscopy. You may have a sigmoidoscopy every 5 years or a colonoscopy every 10 years starting at age 69.  Hepatitis C blood test.  Hepatitis B blood test.  Sexually transmitted disease (STD) testing.  Diabetes screening. This is done by checking your blood sugar (glucose) after you have not eaten for a while (fasting). You may have this done every 1-3 years.  Bone density scan. This is done to screen for osteoporosis. You may have this done starting at age 61.  Mammogram. This may be done every 1-2 years. Talk to your health care provider about how often you should have regular mammograms. Talk with your health care provider about your test results, treatment options, and if necessary, the need for more tests. Vaccines  Your health care provider may recommend  certain vaccines, such as:  Influenza vaccine. This is recommended every year.  Tetanus, diphtheria, and acellular pertussis (Tdap, Td) vaccine. You may need a Td booster every 10 years.  Zoster vaccine. You may need this after age  52.  Pneumococcal 13-valent conjugate (PCV13) vaccine. One dose is recommended after age 28.  Pneumococcal polysaccharide (PPSV23) vaccine. One dose is recommended after age 13. Talk to your health care provider about which screenings and vaccines you need and how often you need them. This information is not intended to replace advice given to you by your health care provider. Make sure you discuss any questions you have with your health care provider. Document Released: 09/21/2015 Document Revised: 05/14/2016 Document Reviewed: 06/26/2015 Elsevier Interactive Patient Education  2017 [place] Prevention in the Home Falls can cause injuries. They can happen to people of all ages. There are many things you can do to make your home safe and to help prevent falls. What can I do on the outside of my home?  Regularly fix the edges of walkways and driveways and fix any cracks.  Remove anything that might make you trip as you walk through a door, such as a raised step or threshold.  Trim any bushes or trees on the path to your home.  Use bright outdoor lighting.  Clear any walking paths of anything that might make someone trip, such as rocks or tools.  Regularly check to see if handrails are loose or broken. Make sure that both sides of any steps have handrails.  Any raised decks and porches should have guardrails on the edges.  Have any leaves, snow, or ice cleared regularly.  Use sand or salt on walking paths during winter.  Clean up any spills in your garage right away. This includes oil or grease spills. What can I do in the bathroom?  Use night lights.  Install grab bars by the toilet and in the tub and shower. Do not use towel bars as grab bars.  Use non-skid mats or decals in the tub or shower.  If you need to sit down in the shower, use a plastic, non-slip stool.  Keep the floor dry. Clean up any water that spills on the floor as soon as it happens.  Remove  soap buildup in the tub or shower regularly.  Attach bath mats securely with double-sided non-slip rug tape.  Do not have throw rugs and other things on the floor that can make you trip. What can I do in the bedroom?  Use night lights.  Make sure that you have a light by your bed that is easy to reach.  Do not use any sheets or blankets that are too big for your bed. They should not hang down onto the floor.  Have a firm chair that has side arms. You can use this for support while you get dressed.  Do not have throw rugs and other things on the floor that can make you trip. What can I do in the kitchen?  Clean up any spills right away.  Avoid walking on wet floors.  Keep items that you use a lot in easy-to-reach places.  If you need to reach something above you, use a strong step stool that has a grab bar.  Keep electrical cords out of the way.  Do not use floor polish or wax that makes floors slippery. If you must use wax, use non-skid floor wax.  Do not have throw rugs and other  things on the floor that can make you trip. What can I do with my stairs?  Do not leave any items on the stairs.  Make sure that there are handrails on both sides of the stairs and use them. Fix handrails that are broken or loose. Make sure that handrails are as long as the stairways.  Check any carpeting to make sure that it is firmly attached to the stairs. Fix any carpet that is loose or worn.  Avoid having throw rugs at the top or bottom of the stairs. If you do have throw rugs, attach them to the floor with carpet tape.  Make sure that you have a light switch at the top of the stairs and the bottom of the stairs. If you do not have them, ask someone to add them for you. What else can I do to help prevent falls?  Wear shoes that:  Do not have high heels.  Have rubber bottoms.  Are comfortable and fit you well.  Are closed at the toe. Do not wear sandals.  If you use a  stepladder:  Make sure that it is fully opened. Do not climb a closed stepladder.  Make sure that both sides of the stepladder are locked into place.  Ask someone to hold it for you, if possible.  Clearly mark and make sure that you can see:  Any grab bars or handrails.  First and last steps.  Where the edge of each step is.  Use tools that help you move around (mobility aids) if they are needed. These include:  Canes.  Walkers.  Scooters.  Crutches.  Turn on the lights when you go into a dark area. Replace any light bulbs as soon as they burn out.  Set up your furniture so you have a clear path. Avoid moving your furniture around.  If any of your floors are uneven, fix them.  If there are any pets around you, be aware of where they are.  Review your medicines with your doctor. Some medicines can make you feel dizzy. This can increase your chance of falling. Ask your doctor what other things that you can do to help prevent falls. This information is not intended to replace advice given to you by your health care provider. Make sure you discuss any questions you have with your health care provider. Document Released: 06/21/2009 Document Revised: 01/31/2016 Document Reviewed: 09/29/2014 Elsevier Interactive Patient Education  2017 [employer].

## 2021-01-24 NOTE — Progress Notes (Addendum)
Subjective:   Kathleen Morse is a 70 y.o. female who presents for Medicare Annual (Subsequent) preventive examination.  Review of Systems     Cardiac Risk Factors include: hypertension;dyslipidemia;obesity (BMI >30kg/m2);advanced age (>17men, >93 women)     Objective:    Today's Vitals   01/24/21 0845  BP: 110/78  Pulse: 100  Temp: 98 F (36.7 C)  SpO2: 97%  Weight: 153 lb 12.8 oz (69.8 kg)   Body mass index is 32.14 kg/m.  Advanced Directives 01/24/2021 11/13/2020 12/19/2019  Does Patient Have a Medical Advance Directive? Yes Yes No  Type of Advance Directive Healthcare Power of [place] -  Does patient want to make changes to medical advance directive? - No - Patient declined -  Copy of [place] in Chart? No - copy requested No - copy requested -  Would patient like information on creating a medical advance directive? - - Yes (MAU/Ambulatory/Procedural Areas - Information given)    Current Medications (verified) Outpatient Encounter Medications as of 01/24/2021  Medication Sig  . albuterol (VENTOLIN HFA) 108 (90 Base) MCG/ACT inhaler INHALE 1 TO 2 PUFFS INTO THE LUNGS EVERY 6 HOURS AS NEEDED FOR WHEEZING OR SHORTNESS OF BREATH.  Marland Kitchen amLODipine (NORVASC) 10 MG tablet TAKE 1 TABLET EVERY DAY  . b complex vitamins capsule Take 1 capsule by mouth daily.  Marland Kitchen co-enzyme Q-10 30 MG capsule Take 30 mg by mouth 3 (three) times daily.  . diclofenac (VOLTAREN) 75 MG EC tablet TAKE 1 TABLET TWICE DAILY  . diclofenac Sodium (VOLTAREN) 1 % GEL Apply 2 g topically 4 (four) times daily as needed.  . latanoprost (XALATAN) 0.005 % ophthalmic solution 1 drop at bedtime.  . montelukast (SINGULAIR) 10 MG tablet Take 10 mg by mouth at bedtime.  Marland Kitchen omeprazole (PRILOSEC) 20 MG capsule Take 20 mg by mouth daily. Twice a day  . pravastatin (PRAVACHOL) 80 MG tablet Take 80 mg by mouth daily.  Marland Kitchen triamcinolone cream (KENALOG) 0.1 % APPLY 1  APPLICATION TOPICALLY 2 (TWO) TIMES DAILY FOR 2 WEEKS, THEN AS NEEDED  . PFIZER-BIONT COVID-19 VAC-TRIS SUSP injection    No facility-administered encounter medications on file as of 01/24/2021.    Allergies (verified) Penicillins   History: Past Medical History:  Diagnosis Date  . Asthma   . Glaucoma 09/30/2019  . History of blood transfusion 08/25/2019  . Hyperlipidemia   . Hypertension   . Obesity (BMI 30-39.9) 09/30/2019  . 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 Osteopenia after menopause 12/27/2019   DEXA 12/2019 T = -1.2 lowest, left femur. Recheck 2-3 years.    Past Surgical History:  Procedure Laterality Date  . ABDOMINAL HYSTERECTOMY  1983  . CESAREAN SECTION  1971   Family History  Problem Relation Age of Onset  . Arthritis Mother   . Birth defects Mother   . Diabetes Mother   . Depression Father   . Cancer Sister   . Heart attack Maternal Grandmother   . Stroke Maternal Grandfather    Social History   Socioeconomic History  . Marital status: Married    Spouse name: Not on file  . Number of children: 1  . Years of education: Not on file  . Highest education level: Not on file  Occupational History  . Occupation: Optometrist retired  Tobacco Use  . Smoking status: Former Research scientist (life sciences)  . Smokeless tobacco: Never Used  Substance and Sexual Activity  . Alcohol use:  Yes  . Drug use: Never  . Sexual activity: Not on file  Other Topics Concern  . Not on file  Social History Narrative  . Not on file   Social Determinants of Health   Financial Resource Strain: Low Risk   . Difficulty of Paying Living Expenses: Not hard at all  Food Insecurity: No Food Insecurity  . Worried About Charity fundraiser in the Last Year: Never true  . Ran Out of Food in the Last Year: Never true  Transportation Needs: No Transportation Needs  . Lack of Transportation (Medical): No  . Lack of Transportation (Non-Medical): No  Physical  Activity: Insufficiently Active  . Days of Exercise per Week: 1 day  . Minutes of Exercise per Session: 30 min  Stress: No Stress Concern Present  . Feeling of Stress : Not at all  Social Connections: Moderately Isolated  . Frequency of Communication with Friends and Family: More than three times a week  . Frequency of Social Gatherings with Friends and Family: More than three times a week  . Attends Religious Services: Never  . Active Member of Clubs or Organizations: No  . Attends Archivist Meetings: Never  . Marital Status: Married    Tobacco Counseling Counseling given: Not Answered   Clinical Intake:  Pre-visit preparation completed: Yes  Pain : No/denies pain     BMI - recorded: 32.14 Nutritional Status: BMI > 30  Obese Nutritional Risks: None Diabetes: No  How often do you need to have someone help you when you read instructions, pamphlets, or other written materials from your doctor or pharmacy?: 1 - Never  Diabetic?No  Interpreter Needed?: No  Information entered by :: Charlott Rakes, LPN   Activities of Daily Living In your present state of health, do you have any difficulty performing the following activities: 01/24/2021 04/09/2020  Hearing? N N  Vision? N N  Difficulty concentrating or making decisions? N N  Walking or climbing stairs? N N  Dressing or bathing? N N  Doing errands, shopping? N N  Preparing Food and eating ? N -  Using the Toilet? N -  In the past six months, have you accidently leaked urine? N -  Do you have problems with loss of bowel control? N -  Managing your Medications? N -  Managing your Finances? N -  Housekeeping or managing your Housekeeping? N -  Some recent data might be hidden    Patient Care Team: Leamon Arnt, MD as PCP - General (Family Medicine)  Indicate any recent Medical Services you may have received from other than [facility] providers in the past year (date may be approximate).     Assessment:    This is a routine wellness examination for [name].  Hearing/Vision screen  Hearing Screening   125Hz  250Hz  500Hz  1000Hz  2000Hz  3000Hz  4000Hz  6000Hz  8000Hz   Right ear:           Left ear:           Comments: Pt denies any hearing issues   Vision Screening Comments: Pt follows up with Dr Ronnald Ramp for annual eye exams   Dietary issues and exercise activities discussed: Current Exercise Habits: The patient does not participate in regular exercise at present  Goals Addressed            This Visit's Progress   . Patient Stated       Lose weight       Depression Screen PHQ 2/9 Scores  01/24/2021 12/19/2019 09/30/2019  PHQ - 2 Score 0 0 0    Fall Risk Fall Risk  01/24/2021 04/09/2020 12/19/2019  Falls in the past year? 0 0 0  Number falls in past yr: 0 0 0  Injury with Fall? 0 0 0  Risk for fall due to : Impaired vision - -  Follow up Falls prevention discussed - Falls evaluation completed;Education provided;Falls prevention discussed    FALL RISK PREVENTION PERTAINING TO THE HOME:  Any stairs in or around the home? Yes  If so, are there any without handrails? No  Home free of loose throw rugs in walkways, pet beds, electrical cords, etc? Yes  Adequate lighting in your home to reduce risk of falls? Yes   ASSISTIVE DEVICES UTILIZED TO PREVENT FALLS:  Life alert? No  Use of a cane, walker or w/c? No  Grab bars in the bathroom? No  Shower chair or bench in shower? No  Elevated toilet seat or a handicapped toilet? No   TIMED UP AND GO:  Was the test performed? Yes .  Length of time to ambulate 10 feet: 10 sec.   Gait steady and fast without use of assistive device  Cognitive Function:     6CIT Screen 01/24/2021 12/19/2019  What Year? 0 points 0 points  What month? 0 points 0 points  What time? - 0 points  Count back from 20 0 points 0 points  Months in reverse 0 points 0 points  Repeat phrase 10 points 0 points  Total Score - 0    Immunizations Immunization History   Administered Date(s) Administered  . Fluad Quad(high Dose 65+) 05/22/2020  . Influenza, High Dose Seasonal PF 06/09/2018  . Influenza,inj,Quad PF,6+ Mos 06/08/2019  . PFIZER(Purple Top)SARS-COV-2 Vaccination 12/15/2019, 01/09/2020, 07/29/2020, 12/11/2020  . Pneumococcal Conjugate-13 09/06/2020  . Pneumococcal Polysaccharide-23 08/08/2019  . Tdap 07/08/2019  . Zoster Recombinat (Shingrix) 07/08/2019    TDAP status: Up to date  Flu Vaccine status: Up to date  Pneumococcal vaccine status: Up to date  Covid-19 vaccine status: Completed vaccines  Qualifies for Shingles Vaccine? Yes   Zostavax completed Yes   Shingrix Completed?: Yes  Screening Tests Health Maintenance  Topic Date Due  . MAMMOGRAM  12/25/2020  . INFLUENZA VACCINE  04/08/2021  . DEXA SCAN  12/26/2022  . Fecal DNA (Cologuard)  01/07/2023  . TETANUS/TDAP  07/07/2029  . COVID-19 Vaccine  Completed  . Hepatitis C Screening  Completed  . PNA vac Low Risk Adult  Completed  . HPV VACCINES  Aged Out    Health Maintenance  Health Maintenance Due  Topic Date Due  . MAMMOGRAM  12/25/2020    Colorectal cancer screening: Type of screening: Cologuard. Completed 01/07/20. Repeat every 3 years  Mammogram status: Ordered 01/24/21. Pt provided with contact info and advised to call to schedule appt.   Bone Density status: Completed 12/26/19. Results reflect: Bone density results: OSTEOPENIA. Repeat every 3 years.   Additional Screening:  Hepatitis C Screening: Completed 09/06/20  Vision Screening: Recommended annual ophthalmology exams for early detection of glaucoma and other disorders of the eye. Is the patient up to date with their annual eye exam?  Yes  Who is the provider or what is the name of the office in which the patient attends annual eye exams? Dr Ronnald Ramp  If pt is not established with a provider, would they like to be referred to a provider to establish care? No .   Dental Screening: Recommended annual  dental  exams for proper oral hygiene  Community Resource Referral / Chronic Care Management: CRR required this visit?  No   CCM required this visit?  No      Plan:     I have personally reviewed and noted the following in the patient's chart:   . Medical and social history . Use of alcohol, tobacco or illicit drugs  . Current medications and supplements including opioid prescriptions.  . Functional ability and status . Nutritional status . Physical activity . Advanced directives . List of other physicians . Hospitalizations, surgeries, and ER visits in previous 12 months . Vitals . Screenings to include cognitive, depression, and falls . Referrals and appointments  In addition, I have reviewed and discussed with patient certain preventive protocols, quality metrics, and best practice recommendations. A written personalized care plan for preventive services as well as general preventive health recommendations were provided to patient.     Marzella Schlein, LPN   5/36/1443   Nurse Notes:    Pt stated she has started taking Prilosec OTC twice a day for burping/ indigestion. Added to medication list

## 2021-02-14 ENCOUNTER — Ambulatory Visit
Admission: RE | Admit: 2021-02-14 | Discharge: 2021-02-14 | Disposition: A | Discharge status: Home / Self Care | Payer: Medicare HMO | Source: Ambulatory Visit | Attending: Family Medicine | Admitting: Family Medicine

## 2021-02-14 ENCOUNTER — Other Ambulatory Visit: Payer: Self-pay

## 2021-02-14 DIAGNOSIS — Z1231 Encounter for screening mammogram for malignant neoplasm of breast: Secondary | ICD-10-CM | POA: Diagnosis not present

## 2021-02-14 DIAGNOSIS — R6889 Other general symptoms and signs: Secondary | ICD-10-CM | POA: Diagnosis not present

## 2021-02-18 ENCOUNTER — Other Ambulatory Visit: Payer: Self-pay | Admitting: Family Medicine

## 2021-02-18 DIAGNOSIS — R928 Other abnormal and inconclusive findings on diagnostic imaging of breast: Secondary | ICD-10-CM

## 2021-03-07 ENCOUNTER — Other Ambulatory Visit: Payer: Self-pay

## 2021-03-07 ENCOUNTER — Ambulatory Visit (INDEPENDENT_AMBULATORY_CARE_PROVIDER_SITE_OTHER): Payer: Medicare HMO | Admitting: Family Medicine

## 2021-03-07 VITALS — BP 134/77 | HR 95 | Temp 97.5°F | Ht <= 58 in | Wt 151.6 lb

## 2021-03-07 DIAGNOSIS — I10 Essential (primary) hypertension: Secondary | ICD-10-CM

## 2021-03-07 DIAGNOSIS — E782 Mixed hyperlipidemia: Secondary | ICD-10-CM

## 2021-03-07 DIAGNOSIS — M8580 Other specified disorders of bone density and structure, unspecified site: Secondary | ICD-10-CM

## 2021-03-07 DIAGNOSIS — G8929 Other chronic pain: Secondary | ICD-10-CM

## 2021-03-07 DIAGNOSIS — M545 Low back pain, unspecified: Secondary | ICD-10-CM

## 2021-03-07 DIAGNOSIS — E669 Obesity, unspecified: Secondary | ICD-10-CM

## 2021-03-07 DIAGNOSIS — Z780 Asymptomatic menopausal state: Secondary | ICD-10-CM

## 2021-03-07 DIAGNOSIS — M47816 Spondylosis without myelopathy or radiculopathy, lumbar region: Secondary | ICD-10-CM

## 2021-03-07 DIAGNOSIS — Z0000 Encounter for general adult medical examination without abnormal findings: Secondary | ICD-10-CM | POA: Diagnosis not present

## 2021-03-07 MED ORDER — HYDROCHLOROTHIAZIDE 12.5 MG PO CAPS: 12.5000 mg | ORAL_CAPSULE | Freq: Every day | ORAL | 3 refills | Status: DC

## 2021-03-07 MED ORDER — ROSUVASTATIN CALCIUM 20 MG PO TABS: 20.0000 mg | ORAL_TABLET | Freq: Every evening | ORAL | 3 refills | Status: DC

## 2021-03-07 MED ORDER — SHINGRIX 50 MCG/0.5ML IM SUSR: 0.5000 mL | Freq: Once | INTRAMUSCULAR | 0 refills | Status: AC

## 2021-03-07 NOTE — Patient Instructions (Signed)
Please return in 6 months for hypertension follow up and lab work. Please come fasting.   You may be due for your 2nd Shingrix vaccination. Please take the prescription for Shingrix to the pharmacy ([facility]) so they may administer the vaccination. Your insurance will then cover the injection.   I have ordered a new cholesterol lowering medication for you called rosuvastatin. Please start it nightly.  As well, please add the new blood pressure pill, hydrochlorothiazide to the amlodipine daily.   If you have any questions or concerns, please don't hesitate to send me a message via MyChart or call the office at [phone]. Thank you for visiting with Kathleen Morse today! It's our pleasure caring for you.   Hypertension, Adult High blood pressure (hypertension) is when the force of blood pumping through the arteries is too strong. The arteries are the blood vessels that carry blood from the heart throughout the body. Hypertension forces the heart to work harder to pump blood and may cause arteries to become narrow or stiff. Untreated or uncontrolled hypertension can cause a heart attack, heart failure, a stroke, kidney disease, and otherproblems. A blood pressure reading consists of a higher number over a lower number. Ideally, your blood pressure should be below 120/80. The first ("top") number is called the systolic pressure. It is a measure of the pressure in your arteries as your heart beats. The second ("bottom") number is called the diastolic pressure. It is a measure of the pressure in your arteries as theheart relaxes. What are the causes? The exact cause of this condition is not known. There are some conditions thatresult in or are related to high blood pressure. What increases the risk? Some risk factors for high blood pressure are under your control. The following factors may make you more likely to develop this condition: Smoking. Having type 2 diabetes mellitus, high cholesterol, or both. Not  getting enough exercise or physical activity. Being overweight. Having too much fat, sugar, calories, or salt (sodium) in your diet. Drinking too much alcohol. Some risk factors for high blood pressure may be difficult or impossible to change. Some of these factors include: Having chronic kidney disease. Having a family history of high blood pressure. Age. Risk increases with age. Race. You may be at higher risk if you are African American. Gender. Men are at higher risk than women before age 72. After age 74, women are at higher risk than men. Having obstructive sleep apnea. Stress. What are the signs or symptoms? High blood pressure may not cause symptoms. Very high blood pressure (hypertensive crisis) may cause: Headache. Anxiety. Shortness of breath. Nosebleed. Nausea and vomiting. Vision changes. Severe chest pain. Seizures. How is this diagnosed? This condition is diagnosed by measuring your blood pressure while you are seated, with your arm resting on a flat surface, your legs uncrossed, and your feet flat on the floor. The cuff of the blood pressure monitor will be placed directly against the skin of your upper arm at the level of your heart. It should be measured at least twice using the same arm. Certain conditions cancause a difference in blood pressure between your right and left arms. Certain factors can cause blood pressure readings to be lower or higher than normal for a short period of time: When your blood pressure is higher when you are in a health care provider's office than when you are at home, this is called white coat hypertension. Most people with this condition do not need medicines. When your blood  pressure is higher at home than when you are in a health care provider's office, this is called masked hypertension. Most people with this condition may need medicines to control blood pressure. If you have a high blood pressure reading during one visit or you have  normal blood pressure with other risk factors, you may be asked to: Return on a different day to have your blood pressure checked again. Monitor your blood pressure at home for 1 week or longer. If you are diagnosed with hypertension, you may have other blood or imaging tests to help your health care provider understand your overall risk for otherconditions. How is this treated? This condition is treated by making healthy lifestyle changes, such as eating healthy foods, exercising more, and reducing your alcohol intake. Your health care provider may prescribe medicine if lifestyle changes are not enough to get your blood pressure under control, and if: Your systolic blood pressure is above 130. Your diastolic blood pressure is above 80. Your personal target blood pressure may vary depending on your medicalconditions, your age, and other factors. Follow these instructions at home: Eating and drinking  Eat a diet that is high in fiber and potassium, and low in sodium, added sugar, and fat. An example eating plan is called the DASH (Dietary Approaches to Stop Hypertension) diet. To eat this way: Eat plenty of fresh fruits and vegetables. Try to fill one half of your plate at each meal with fruits and vegetables. Eat whole grains, such as whole-wheat pasta, brown rice, or whole-grain bread. Fill about one fourth of your plate with whole grains. Eat or drink low-fat dairy products, such as skim milk or low-fat yogurt. Avoid fatty cuts of meat, processed or cured meats, and poultry with skin. Fill about one fourth of your plate with lean proteins, such as fish, chicken without skin, beans, eggs, or tofu. Avoid pre-made and processed foods. These tend to be higher in sodium, added sugar, and fat. Reduce your daily sodium intake. Most people with hypertension should eat less than 1,500 mg of sodium a day. Do not drink alcohol if: Your health care provider tells you not to drink. You are pregnant, may be  pregnant, or are planning to become pregnant. If you drink alcohol: Limit how much you use to: 0-1 drink a day for women. 0-2 drinks a day for men. Be aware of how much alcohol is in your drink. In the [place] one drink equals one 12 oz bottle of beer (355 mL), one 5 oz glass of wine (148 mL), or one 1 oz glass of hard liquor (44 mL).  Lifestyle  Work with your health care provider to maintain a healthy body weight or to lose weight. Ask what an ideal weight is for you. Get at least 30 minutes of exercise most days of the week. Activities may include walking, swimming, or biking. Include exercise to strengthen your muscles (resistance exercise), such as Pilates or lifting weights, as part of your weekly exercise routine. Try to do these types of exercises for 30 minutes at least 3 days a week. Do not use any products that contain nicotine or tobacco, such as cigarettes, e-cigarettes, and chewing tobacco. If you need help quitting, ask your health care provider. Monitor your blood pressure at home as told by your health care provider. Keep all follow-up visits as told by your health care provider. This is important.  Medicines Take over-the-counter and prescription medicines only as told by your health care provider. Follow directions  carefully. Blood pressure medicines must be taken as prescribed. Do not skip doses of blood pressure medicine. Doing this puts you at risk for problems and can make the medicine less effective. Ask your health care provider about side effects or reactions to medicines that you should watch for. Contact a health care provider if you: Think you are having a reaction to a medicine you are taking. Have headaches that keep coming back (recurring). Feel dizzy. Have swelling in your ankles. Have trouble with your vision. Get help right away if you: Develop a severe headache or confusion. Have unusual weakness or numbness. Feel faint. Have severe pain in your chest  or abdomen. Vomit repeatedly. Have trouble breathing. Summary Hypertension is when the force of blood pumping through your arteries is too strong. If this condition is not controlled, it may put you at risk for serious complications. Your personal target blood pressure may vary depending on your medical conditions, your age, and other factors. For most people, a normal blood pressure is less than 120/80. Hypertension is treated with lifestyle changes, medicines, or a combination of both. Lifestyle changes include losing weight, eating a healthy, low-sodium diet, exercising more, and limiting alcohol. This information is not intended to replace advice given to you by your health care provider. Make sure you discuss any questions you have with your healthcare provider. Document Revised: 05/05/2018 Document Reviewed: 05/05/2018 Elsevier Patient Education  [place].

## 2021-03-07 NOTE — Progress Notes (Signed)
Subjective  Chief Complaint  Patient presents with   Annual Exam    HPI: Kathleen Morse is a 70 y.o. female who presents to [facility] [place] today for a Female Wellness Visit. She also has the concerns and/or needs as listed above in the chief complaint. These will be addressed in addition to the Health Maintenance Visit.   Wellness Visit: annual visit with health maintenance review and exam without Pap  HM: screens are current and up to date. Due for 2nd shingrix from our records. Thinks she got the first dose from [facility].   Chronic disease f/u and/or acute problem visit: (deemed necessary to be done in addition to the wellness visit): HLD: has been on pravastatin for many years. Last LDL > 100. Tolerates well. Diet is fair.  Obesity: weight is stable HTN: on amlodipine 10 daily. No Aes. No cp or sob or leg edema. Nl renal function Low back pain with DJD; did well with PT. No longer bothering her.  Belching : resolved with a course of prilosec  Assessment  1. Annual physical exam   2. Mixed hyperlipidemia   3. Essential hypertension   4. Osteopenia after menopause   5. Obesity (BMI 30-39.9)   6. Chronic midline low back pain without sciatica   7. 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 Included topics are cancer screening recommendations, ways to keep healthy (see AVS) including dietary and exercise recommendations, regular eye and dental care, use of seat belts, and avoidance of moderate alcohol use and tobacco use.  BMI: discussed patient's BMI and encouraged positive lifestyle modifications to help get to or maintain a target BMI. HM needs and immunizations were addressed and ordered. See below for orders. See HM and immunization section for updates. Rec f/u with [facility] to update 2nd shingrix  vaccination. Counseling done.  Routine labs and screening tests ordered including cmp, cbc and lipids where appropriate. Discussed recommendations regarding Vit D and calcium supplementation (see AVS)  Chronic disease management visit and/or acute problem visit: HTN: fair control; add microzide to amlodipine 10. Education given HLD: change to crestor 20 and recheck lipids; will push ldl < 100. Low back pain: improved.  Gerd/belching: resolved.   Follow up: 6 mo htn and hld recheck.   No orders of the defined types were placed in this encounter.  No orders of the defined types were placed in this encounter.     There is no height or weight on file to calculate BMI. Wt Readings from Last 3 Encounters:  01/24/21 153 lb 12.8 oz (69.8 kg)  10/30/20 151 lb (68.5 kg)  09/06/20 155 lb 8 oz (70.5 kg)     Patient Active Problem List   Diagnosis Date Noted   Intrinsic eczema 04/09/2020   Spondylosis of cervical region without myelopathy or radiculopathy 04/09/2020   Lipoma of neck 04/09/2020   Osteopenia after menopause 12/27/2019    DEXA 12/2019 T = -1.2 lowest, left femur. Recheck 2-3 years.      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 09/30/2019   Obesity (BMI 30-39.9) 09/30/2019   Moderate persistent asthma 08/25/2019   Essential hypertension 08/25/2019   Mixed hyperlipidemia 08/25/2019   Health Maintenance  Topic Date Due   Zoster Vaccines- Shingrix (2  of 2) 09/02/2019   INFLUENZA VACCINE  04/08/2021   COVID-19 Vaccine (5 - Booster for Pfizer series) 04/22/2021   MAMMOGRAM  02/14/2022   DEXA SCAN  12/26/2022   Fecal DNA (Cologuard)  01/07/2023   TETANUS/TDAP  07/07/2029   Hepatitis C Screening  Completed   PNA vac Low Risk Adult  Completed   HPV VACCINES  Aged Out   Immunization History  Administered Date(s) Administered   Fluad Quad(high Dose 65+) 05/22/2020    Influenza, High Dose Seasonal PF 06/09/2018   Influenza,inj,Quad PF,6+ Mos 06/08/2019   PFIZER(Purple Top)SARS-COV-2 Vaccination 12/15/2019, 01/09/2020, 07/29/2020, 12/21/2020   Pneumococcal Conjugate-13 09/06/2020   Pneumococcal Polysaccharide-23 08/08/2019   Tdap 07/08/2019   Zoster Recombinat (Shingrix) 07/08/2019   We updated and reviewed the patient's past history in detail and it is documented below. Allergies: Patient is allergic to penicillins. 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 Past Surgical History Patient  has a past surgical history that includes Abdominal hysterectomy (1983) and Cesarean section (1971). Family History: Patient family history includes Arthritis in her mother; Birth defects in her mother; Cancer in her sister; Depression in her father; Diabetes in her mother; Heart attack in her maternal grandmother; Stroke in her maternal grandfather. Social History:  Patient  reports that she has quit smoking. She has never used smokeless tobacco. She reports current alcohol use. She reports that she does not use drugs.  Review of Systems: Constitutional: negative for fever or malaise Ophthalmic: negative for photophobia, double vision or loss of vision Cardiovascular: negative for chest pain, dyspnea on exertion, or new LE swelling Respiratory: negative for SOB or persistent cough Gastrointestinal: negative for abdominal pain, change in bowel habits or melena Genitourinary: negative for dysuria or gross hematuria, no abnormal uterine bleeding or disharge Musculoskeletal: negative for new gait disturbance or muscular weakness Integumentary: negative for new or persistent rashes, no breast  lumps Neurological: negative for TIA or stroke symptoms Psychiatric: negative for SI or delusions Allergic/Immunologic: negative for hives  Patient Care Team    Relationship Specialty Notifications Start End  Leamon Arnt, MD PCP - General Family Medicine  09/30/19     Objective  Vitals: There were no vitals taken for this visit. General:  Well developed, well nourished, no acute distress  Psych:  Alert and orientedx3,normal mood and affect HEENT:  Normocephalic, atraumatic, non-icteric sclera,  supple neck without adenopathy, mass or thyromegaly Cardiovascular:  Normal S1, S2, RRR without gallop, rub or murmur Respiratory:  Good breath sounds bilaterally, CTAB with normal respiratory effort Gastrointestinal: normal bowel sounds, soft, non-tender, no noted masses. No HSM MSK: no deformities, contusions. Joints are without erythema or swelling.  Skin:  Warm, no rashes or suspicious lesions noted Neurologic:    Mental status is normal. CN 2-11 are normal. Gross motor and sensory exams are normal. Normal gait. No tremor   Commons side effects, risks, benefits, and alternatives for medications and treatment plan prescribed today were discussed, and the patient expressed understanding of the given instructions. Patient is instructed to call or message via MyChart if he/she has any questions or concerns regarding our treatment plan. No barriers to understanding were identified. We discussed Red Flag symptoms and signs in detail. Patient expressed understanding regarding what to do in case of urgent or emergency type symptoms.  Medication  list was reconciled, printed and provided to the patient in AVS. Patient instructions and summary information was reviewed with the patient as documented in the AVS. This note was prepared with assistance of Dragon voice recognition software. Occasional wrong-word or sound-a-like substitutions may have occurred due to the inherent limitations of voice  recognition software  This visit occurred during the SARS-CoV-2 public health emergency.  Safety protocols were in place, including screening questions prior to the visit, additional usage of staff PPE, and extensive cleaning of exam room while observing appropriate contact time as indicated for disinfecting solutions.

## 2021-03-14 ENCOUNTER — Ambulatory Visit
Admission: RE | Admit: 2021-03-14 | Discharge: 2021-03-14 | Disposition: A | Discharge status: Home / Self Care | Payer: Medicare HMO | Source: Ambulatory Visit | Attending: Family Medicine | Admitting: Family Medicine

## 2021-03-14 ENCOUNTER — Other Ambulatory Visit: Payer: Self-pay

## 2021-03-14 DIAGNOSIS — R928 Other abnormal and inconclusive findings on diagnostic imaging of breast: Secondary | ICD-10-CM

## 2021-03-14 DIAGNOSIS — N6489 Other specified disorders of breast: Secondary | ICD-10-CM | POA: Diagnosis not present

## 2021-03-14 DIAGNOSIS — R6889 Other general symptoms and signs: Secondary | ICD-10-CM | POA: Diagnosis not present

## 2021-05-09 ENCOUNTER — Ambulatory Visit: Payer: Medicare HMO | Admitting: Physician Assistant

## 2021-06-03 ENCOUNTER — Telehealth: Payer: Self-pay

## 2021-06-03 ENCOUNTER — Other Ambulatory Visit: Payer: Self-pay

## 2021-06-03 MED ORDER — ALBUTEROL SULFATE HFA 108 (90 BASE) MCG/ACT IN AERS: INHALATION_SPRAY | RESPIRATORY_TRACT | 1 refills | Status: DC

## 2021-06-03 NOTE — Telephone Encounter (Signed)
Has been sent in  

## 2021-06-06 ENCOUNTER — Telehealth: Payer: Self-pay

## 2021-06-06 ENCOUNTER — Other Ambulatory Visit: Payer: Self-pay

## 2021-06-06 ENCOUNTER — Other Ambulatory Visit: Payer: Self-pay | Admitting: Family Medicine

## 2021-06-06 MED ORDER — ROSUVASTATIN CALCIUM 20 MG PO TABS: 20.0000 mg | ORAL_TABLET | Freq: Every evening | ORAL | 3 refills | Status: DC

## 2021-06-06 NOTE — Telephone Encounter (Signed)
LAST APPOINTMENT DATE:    NEXT APPOINTMENT DATE:  MEDICATION:naproxen   rosuvastatin (CRESTOR) 20 MG tablet  PHARMACY:  [facility], [place]

## 2021-06-06 NOTE — Telephone Encounter (Signed)
Medication has been sent in 

## 2021-06-06 NOTE — Telephone Encounter (Signed)
LAST APPOINTMENT DATE:  03/07/21  NEXT APPOINTMENT DATE: none  MEDICATION:rosuvastatin (CRESTOR) 20 MG tablet  Naproxen   [facility], [place]

## 2021-06-10 ENCOUNTER — Other Ambulatory Visit: Payer: Self-pay

## 2021-06-10 ENCOUNTER — Telehealth: Payer: Self-pay

## 2021-06-10 MED ORDER — DICLOFENAC SODIUM 75 MG PO TBEC: 75.0000 mg | DELAYED_RELEASE_TABLET | Freq: Two times a day (BID) | ORAL | 0 refills | Status: DC

## 2021-06-10 NOTE — Telephone Encounter (Signed)
Medication has been refilled.

## 2021-06-19 ENCOUNTER — Encounter: Payer: Self-pay | Admitting: Family Medicine

## 2021-06-19 ENCOUNTER — Other Ambulatory Visit: Payer: Self-pay

## 2021-06-19 ENCOUNTER — Ambulatory Visit (INDEPENDENT_AMBULATORY_CARE_PROVIDER_SITE_OTHER): Payer: Medicare HMO | Admitting: Family Medicine

## 2021-06-19 VITALS — BP 146/98 | HR 98 | Temp 97.2°F | Ht <= 58 in | Wt 150.6 lb

## 2021-06-19 DIAGNOSIS — R066 Hiccough: Secondary | ICD-10-CM

## 2021-06-19 DIAGNOSIS — R519 Headache, unspecified: Secondary | ICD-10-CM

## 2021-06-19 DIAGNOSIS — J4540 Moderate persistent asthma, uncomplicated: Secondary | ICD-10-CM | POA: Diagnosis not present

## 2021-06-19 DIAGNOSIS — I10 Essential (primary) hypertension: Secondary | ICD-10-CM

## 2021-06-19 DIAGNOSIS — T887XXA Unspecified adverse effect of drug or medicament, initial encounter: Secondary | ICD-10-CM

## 2021-06-19 MED ORDER — FLOVENT DISKUS 100 MCG/BLIST IN AEPB: 2.0000 | INHALATION_SPRAY | Freq: Two times a day (BID) | RESPIRATORY_TRACT | 11 refills | Status: DC

## 2021-06-19 MED ORDER — OMEPRAZOLE 20 MG PO CPDR: 20.0000 mg | DELAYED_RELEASE_CAPSULE | Freq: Every day | ORAL | 1 refills | Status: DC

## 2021-06-19 MED ORDER — CHLORTHALIDONE 25 MG PO TABS: 25.0000 mg | ORAL_TABLET | Freq: Every day | ORAL | 3 refills | Status: DC

## 2021-06-19 NOTE — Progress Notes (Signed)
Subjective  CC:  Chief Complaint  Patient presents with   Headache    Ongoing since June, have improved slightly    HPI: Kathleen Morse is a 70 y.o. female who presents to the office today to address the problems listed above in the chief complaint. Hypertension f/u: Control is fair . Pt reports she is doing well. However, hctz causes headaches. Gets one daily a little after taking it. No red flag sxs; resolves with tylenol as needed. No neuro defecits. No n/v. Also on amlodipine.Compliance with medication is good. She has not tried stopping it to see if headaches resolve. C/o hiccups daily. Last about 10-15 minutes and resolve with holding breath. No sig gerd sxs but has h/o gerd and belching resolved with PPI. No longer taking it. no abd pain. Asthma: using albueterol daily for "wheezing". Not on a maintenance. Inhaler.   Assessment  1. Uncontrolled hypertension   2. Side effect of medication   3. Hiccups   4. Frequent headaches   5. Moderate persistent asthma without complication      Plan   Hypertension f/u: BP control is poorly controlled. Stop hctz and see if headaches resolve. Trial of chlorthalidone. Will recheck in 3 months with bmp to monitor potassium. Education given.  hiccups f/u: most likely related to gerd. Restart omeprazole 20 daily and f/u if persist or worsen for further eval.  Asthma: uncontrolle.d education given. Start flovent diskus 100 daily and lessen albuterol.  Education regarding management of these chronic disease states was given. Management strategies discussed on successive visits include dietary and exercise recommendations, goals of achieving and maintaining IBW, and lifestyle modifications aiming for adequate sleep and minimizing stressors.   Follow up: Return in about 3 months (around 09/19/2021) for follow up Hypertension, headaches and asthma.  No orders of the defined types were placed in this encounter.  Meds ordered this encounter   Medications   Fluticasone Propionate, Inhal, (FLOVENT DISKUS) 100 MCG/BLIST AEPB    Sig: Inhale 2 puffs into the lungs 2 (two) times daily.    Dispense:  60 each    Refill:  11   chlorthalidone (HYGROTON) 25 MG tablet    Sig: Take 1 tablet (25 mg total) by mouth daily.    Dispense:  90 tablet    Refill:  3   omeprazole (PRILOSEC) 20 MG capsule    Sig: Take 1 capsule (20 mg total) by mouth daily. Twice a day    Dispense:  90 capsule    Refill:  1      BP Readings from Last 3 Encounters:  06/19/21 (!) 146/98  03/07/21 134/77  01/24/21 110/78   Wt Readings from Last 3 Encounters:  06/19/21 150 lb 9.6 oz (68.3 kg)  03/07/21 151 lb 9.6 oz (68.8 kg)  01/24/21 153 lb 12.8 oz (69.8 kg)    Lab Results  Component Value Date   CHOL 209 (H) 10/30/2020   CHOL 200 (H) 09/30/2019   Lab Results  Component Value Date   HDL 64.00 10/30/2020   HDL 52 09/30/2019   Lab Results  Component Value Date   LDLCALC 120 (H) 10/30/2020   LDLCALC 126 (H) 09/30/2019   Lab Results  Component Value Date   TRIG 125.0 10/30/2020   TRIG 111 09/30/2019   Lab Results  Component Value Date   CHOLHDL 3 10/30/2020   CHOLHDL 3.8 09/30/2019   No results found for: LDLDIRECT Lab Results  Component Value Date   CREATININE 0.77 10/30/2020  BUN 10 10/30/2020   NA 138 10/30/2020   K 3.6 10/30/2020   CL 99 10/30/2020   CO2 29 10/30/2020    The 10-year ASCVD risk score (Arnett DK, et al., 2019) is: 15.3%   Values used to calculate the score:     Age: 26 years     Sex: Female     Is Non-Hispanic African American: Yes     Diabetic: No     Tobacco smoker: No     Systolic Blood Pressure: 528 mmHg     Is BP treated: Yes     HDL Cholesterol: 64 mg/dL     Total Cholesterol: 209 mg/dL  I reviewed the patients updated PMH, FH, and SocHx.    Patient Active Problem List   Diagnosis Date Noted   Essential hypertension 08/25/2019    Priority: 1.   Mixed hyperlipidemia 08/25/2019    Priority: 1.    Spondylosis of cervical region without myelopathy or radiculopathy 04/09/2020    Priority: 2.   Osteopenia after menopause 12/27/2019    Priority: 2.   Glaucoma 09/30/2019    Priority: 2.   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   Obesity (BMI 30-39.9) 09/30/2019    Priority: 2.   Moderate persistent asthma 08/25/2019    Priority: 2.   Intrinsic eczema 04/09/2020    Priority: 3.   Lipoma of neck 04/09/2020    Priority: 3.   Chronic midline low back pain without sciatica 09/30/2019    Priority: 3.    Allergies: Penicillins  Social History: Patient  reports that she has quit smoking. She has never used smokeless tobacco. She reports current alcohol use. She reports that she does not use drugs.  Current Meds  Medication Sig   albuterol (VENTOLIN HFA) 108 (90 Base) MCG/ACT inhaler INHALE 1 TO 2 PUFFS INTO THE LUNGS EVERY 6 HOURS AS NEEDED FOR WHEEZING OR SHORTNESS OF BREATH.   amLODipine (NORVASC) 10 MG tablet TAKE 1 TABLET EVERY DAY   b complex vitamins capsule Take 1 capsule by mouth daily.   chlorthalidone (HYGROTON) 25 MG tablet Take 1 tablet (25 mg total) by mouth daily.   co-enzyme Q-10 30 MG capsule Take 30 mg by mouth 3 (three) times daily.   diclofenac Sodium (VOLTAREN) 1 % GEL Apply 2 g topically 4 (four) times daily as needed.   Fluticasone Propionate, Inhal, (FLOVENT DISKUS) 100 MCG/BLIST AEPB Inhale 2 puffs into the lungs 2 (two) times daily.   latanoprost (XALATAN) 0.005 % ophthalmic solution 1 drop at bedtime.   montelukast (SINGULAIR) 10 MG tablet Take 10 mg by mouth at bedtime.   PFIZER-BIONT COVID-19 VAC-TRIS SUSP injection    rosuvastatin (CRESTOR) 20 MG tablet Take 1 tablet (20 mg total) by mouth at bedtime.   triamcinolone cream (KENALOG) 0.1 % APPLY 1 APPLICATION TOPICALLY 2 (TWO) TIMES DAILY FOR 2 WEEKS, THEN AS NEEDED   [DISCONTINUED] hydrochlorothiazide (MICROZIDE) 12.5 MG capsule Take 1 capsule  (12.5 mg total) by mouth daily.    Review of Systems: Cardiovascular: negative for chest pain, palpitations, leg swelling, orthopnea Respiratory: negative for SOB, wheezing or persistent cough Gastrointestinal: negative for abdominal pain Genitourinary: negative for dysuria or gross hematuria  Objective  Vitals: BP (!) 146/98   Pulse 98   Temp (!) 97.2 F (36.2 C) (Temporal)   Ht 4\' 10"  (1.473 m)   Wt 150 lb 9.6 oz (68.3 kg)   SpO2 97%   BMI 31.48  kg/m  General: no acute distress  Psych:  Alert and oriented, normal mood and affect HEENT:  Normocephalic, atraumatic, supple neck  Cardiovascular:  RRR without murmur. no edema Respiratory:  Good breath sounds bilaterally, CTAB with normal respiratory effort Skin:  Warm, no rashes Neurologic:   Mental status is normal Commons side effects, risks, benefits, and alternatives for medications and treatment plan prescribed today were discussed, and the patient expressed understanding of the given instructions. Patient is instructed to call or message via MyChart if he/she has any questions or concerns regarding our treatment plan. No barriers to understanding were identified. We discussed Red Flag symptoms and signs in detail. Patient expressed understanding regarding what to do in case of urgent or emergency type symptoms.  Medication list was reconciled, printed and provided to the patient in AVS. Patient instructions and summary information was reviewed with the patient as documented in the AVS. This note was prepared with assistance of Dragon voice recognition software. Occasional wrong-word or sound-a-like substitutions may have occurred due to the inherent limitations of voice recognition software  This visit occurred during the SARS-CoV-2 public health emergency.  Safety protocols were in place, including screening questions prior to the visit, additional usage of staff PPE, and extensive cleaning of exam room while observing appropriate  contact time as indicated for disinfecting solutions.

## 2021-06-19 NOTE — Patient Instructions (Signed)
Please return in 3 months to recheck hypertension, headaches and asthma.   Please stop the hctz and start the chlorthalidone for your blood pressure. Hopefully this will resolve your headaches.  Restart omeprazole daily to help with the hiccups.  Start using the new inhaler, flovent discus twice a day to help better control your asthma. Only use your albuterol inhaler if needed for wheezing. The new inhaler should make the albuterol needed less frequently.   If you have any questions or concerns, please don't hesitate to send me a message via MyChart or call the office at [phone]. Thank you for visiting with [place] today! It's our pleasure caring for you.   Asthma, Adult Asthma is a long-term (chronic) condition that causes recurrent episodes in which the airways become tight and narrow. The airways are the passages that lead from the nose and mouth down into the lungs. Asthma episodes, also called asthma attacks, can cause coughing, wheezing, shortness of breath, and chest pain. The airways can also fill with mucus. During an attack, it can be difficult to breathe. Asthma attacks can range from minor to life threatening. Asthma cannot be cured, but medicines and lifestyle changes can help control it and treat acute attacks. What are the causes? This condition is believed to be caused by inherited (genetic) and environmental factors, but its exact cause is not known. There are many things that can bring on an asthma attack or make asthma symptoms worse (triggers). Asthma triggers are different for each person. Common triggers include: Mold. Dust. Cigarette smoke. Cockroaches. Things that can cause allergy symptoms (allergens), such as animal dander or pollen from trees or grass. Air pollutants such as household cleaners, wood smoke, smog, or Advertising account planner. Cold air, weather changes, and winds (which increase molds and pollen in the air). Strong emotional expressions such as crying or laughing  hard. Stress. Certain medicines (such as aspirin) or types of medicines (such as beta-blockers). Sulfites in foods and drinks. Foods and drinks that may contain sulfites include dried fruit, potato chips, and sparkling grape juice. Infections or inflammatory conditions such as the flu, a cold, or inflammation of the nasal membranes (rhinitis). Gastroesophageal reflux disease (GERD). Exercise or strenuous activity. What are the signs or symptoms? Symptoms of this condition may occur right after asthma is triggered or many hours later. Symptoms include: Wheezing. This can sound like whistling when you breathe. Excessive nighttime or early morning coughing. Frequent or severe coughing with a common cold. Chest tightness. Shortness of breath. Tiredness (fatigue) with minimal activity. How is this diagnosed? This condition is diagnosed based on: Your medical history. A physical exam. Tests, which may include: Lung function studies and pulmonary studies (spirometry). These tests can evaluate the flow of air in your lungs. Allergy tests. Imaging tests, such as X-rays. How is this treated? There is no cure for this condition, but treatment can help control your symptoms. Treatment for asthma usually involves: Identifying and avoiding your asthma triggers. Using medicines to control your symptoms. Generally, two types of medicines are used to treat asthma: Controller medicines. These help prevent asthma symptoms from occurring. They are usually taken every day. Fast-acting reliever or rescue medicines. These quickly relieve asthma symptoms by widening the narrow and tight airways. They are used as needed and provide short-term relief. Using supplemental oxygen. This may be needed during a severe episode. Using other medicines, such as: Allergy medicines, such as antihistamines, if your asthma attacks are triggered by allergens. Immune medicines (immunomodulators). These are medicines  that help  control the immune system. Creating an asthma action plan. An asthma action plan is a written plan for managing and treating your asthma attacks. This plan includes: A list of your asthma triggers and how to avoid them. Information about when medicines should be taken and when their dosage should be changed. Instructions about using a device called a peak flow meter. A peak flow meter measures how well the lungs are working and the severity of your asthma. It helps you monitor your condition. Follow these instructions at home: Controlling your home environment Control your home environment in the following ways to help avoid triggers and prevent asthma attacks: Change your heating and air conditioning filter regularly. Limit your use of fireplaces and wood stoves. Get rid of pests (such as roaches and mice) and their droppings. Throw away plants if you see mold on them. Clean floors and dust surfaces regularly. Use unscented cleaning products. Try to have someone else vacuum for you regularly. Stay out of rooms while they are being vacuumed and for a short while afterward. If you vacuum, use a dust mask from a hardware store, a double-layered or microfilter vacuum cleaner bag, or a vacuum cleaner with a HEPA filter. Replace carpet with wood, tile, or vinyl flooring. Carpet can trap dander and dust. Use allergy-proof pillows, mattress covers, and box spring covers. Keep your bedroom a trigger-free room. Avoid pets and keep windows closed when allergens are in the air. Wash beddings every week in hot water and dry them in a dryer. Use blankets that are made of polyester or cotton. Clean bathrooms and kitchens with bleach. If possible, have someone repaint the walls in these rooms with mold-resistant paint. Stay out of the rooms that are being cleaned and painted. Wash your hands often with soap and water. If soap and water are not available, use hand sanitizer. Do not allow anyone to smoke in  your home. General instructions Take over-the-counter and prescription medicines only as told by your health care provider. Speak with your health care provider if you have questions about how or when to take the medicines. Make note if you are requiring more frequent dosages. Do not use any products that contain nicotine or tobacco, such as cigarettes and e-cigarettes. If you need help quitting, ask your health care provider. Also, avoid being exposed to secondhand smoke. Use a peak flow meter as told by your health care provider. Record and keep track of the readings. Understand and use the asthma action plan to help minimize, or stop an asthma attack, without needing to seek medical care. Make sure you stay up to date on your yearly vaccinations as told by your health care provider. This may include vaccines for the flu and pneumonia. Avoid outdoor activities when allergen counts are high and when air quality is low. Wear a ski mask that covers your nose and mouth during outdoor winter activities. Exercise indoors on cold days if you can. Warm up before exercising, and take time for a cool-down period after exercise. Keep all follow-up visits as told by your health care provider. This is important. Where to find more information For information about asthma, turn to the Centers for Disease Control and Prevention at http://www.clark.net/ For air quality information, turn to [employer] at https://www.miller-reyes.info/ Contact a health care provider if: You have wheezing, shortness of breath, or a cough even while you are taking medicine to prevent attacks. The mucus you cough up (sputum) is thicker than usual. Your sputum  changes from clear or white to yellow, green, gray, or bloody. Your medicines are causing side effects, such as a rash, itching, swelling, or trouble breathing. You need to use a reliever medicine more than 2-3 times a week. Your peak flow reading is still at 50-79% of your personal best after  following your action plan for 1 hour. You have a fever. Get help right away if: You are getting worse and do not respond to treatment during an asthma attack. You are short of breath when at rest or when doing very little physical activity. You have difficulty eating, drinking, or talking. You have chest pain or tightness. You develop a fast heartbeat or palpitations. You have a bluish color to your lips or fingernails. You are light-headed or dizzy, or you faint. Your peak flow reading is less than 50% of your personal best. You feel too tired to breathe normally. Summary Asthma is a long-term (chronic) condition that causes recurrent episodes in which the airways become tight and narrow. These episodes can cause coughing, wheezing, shortness of breath, and chest pain. Asthma cannot be cured, but medicines and lifestyle changes can help control it and treat acute attacks. Make sure you understand how to avoid triggers and how and when to use your medicines. Asthma attacks can range from minor to life threatening. Get help right away if you have an asthma attack and do not respond to treatment with your usual rescue medicines. This information is not intended to replace advice given to you by your health care provider. Make sure you discuss any questions you have with your health care provider. Document Revised: 05/25/2020 Document Reviewed: 12/28/2019 Elsevier Patient Education  2022 [employer].

## 2021-06-26 ENCOUNTER — Telehealth: Payer: Self-pay

## 2021-06-26 NOTE — Telephone Encounter (Signed)
Patient is calling in stating that [facility] states that they have faxed over a request for a couple of prescriptions and hasnt heard a response. Chyna does not know the name of the medication.

## 2021-06-27 ENCOUNTER — Other Ambulatory Visit: Payer: Self-pay

## 2021-06-27 MED ORDER — CHLORTHALIDONE 25 MG PO TABS: 25.0000 mg | ORAL_TABLET | Freq: Every day | ORAL | 3 refills | Status: DC

## 2021-06-27 MED ORDER — LATANOPROST 0.005 % OP SOLN: 1.0000 [drp] | Freq: Every day | OPHTHALMIC | 2 refills | Status: DC

## 2021-06-27 MED ORDER — MONTELUKAST SODIUM 10 MG PO TABS: 10.0000 mg | ORAL_TABLET | Freq: Every day | ORAL | 3 refills | Status: DC

## 2021-06-27 NOTE — Telephone Encounter (Signed)
Patient does not need any refills currently, will reach back out to [place]

## 2021-06-27 NOTE — Telephone Encounter (Signed)
Sent in medications that patient found she needed refilled

## 2021-07-28 IMAGING — DX DG LUMBAR SPINE COMPLETE 4+V
5 series · 5 of 5 positions shown · non-contrast
Comparison: None.

CLINICAL DATA: Low back pain.

EXAM:
LUMBAR SPINE - COMPLETE 4+ VIEW

[lumbar spine ap]
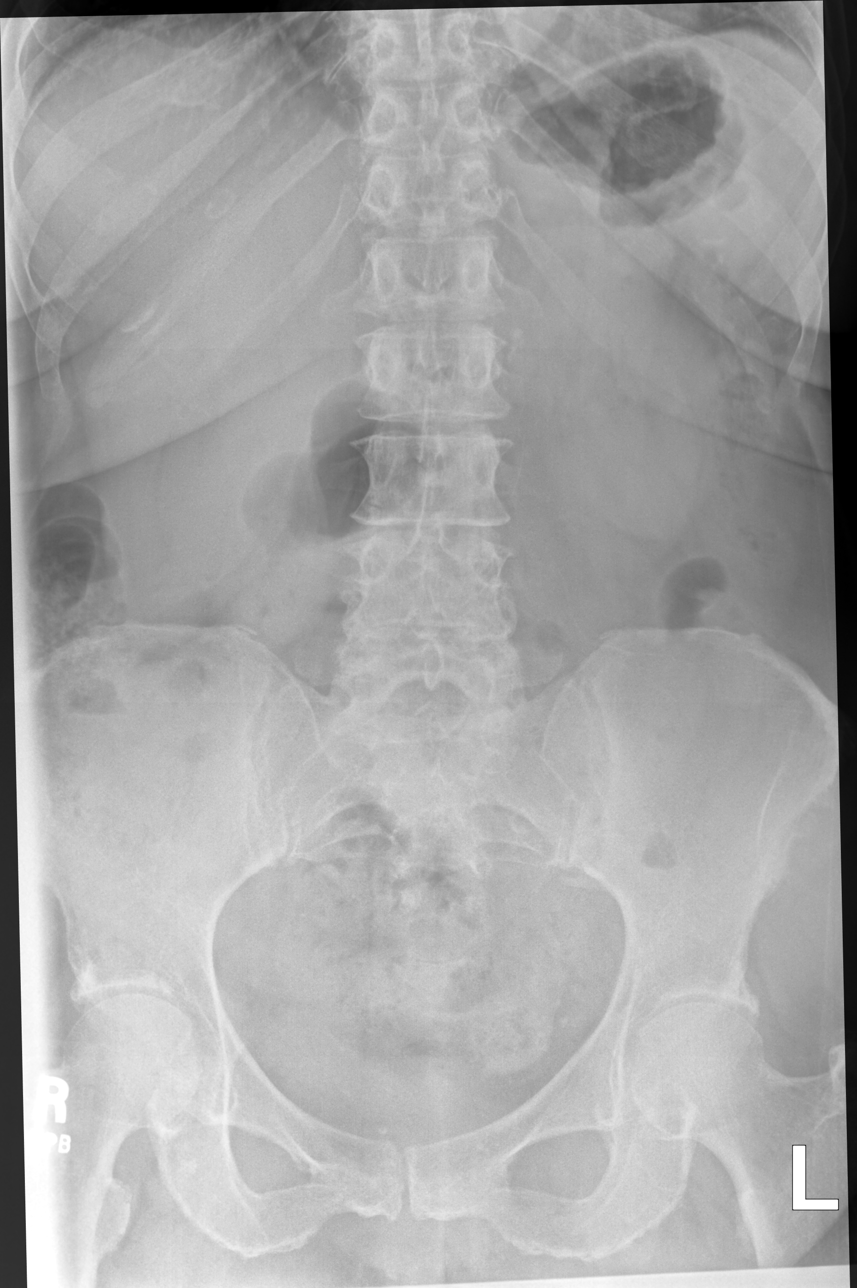

[lumbar spine oblique (1 of 2)]
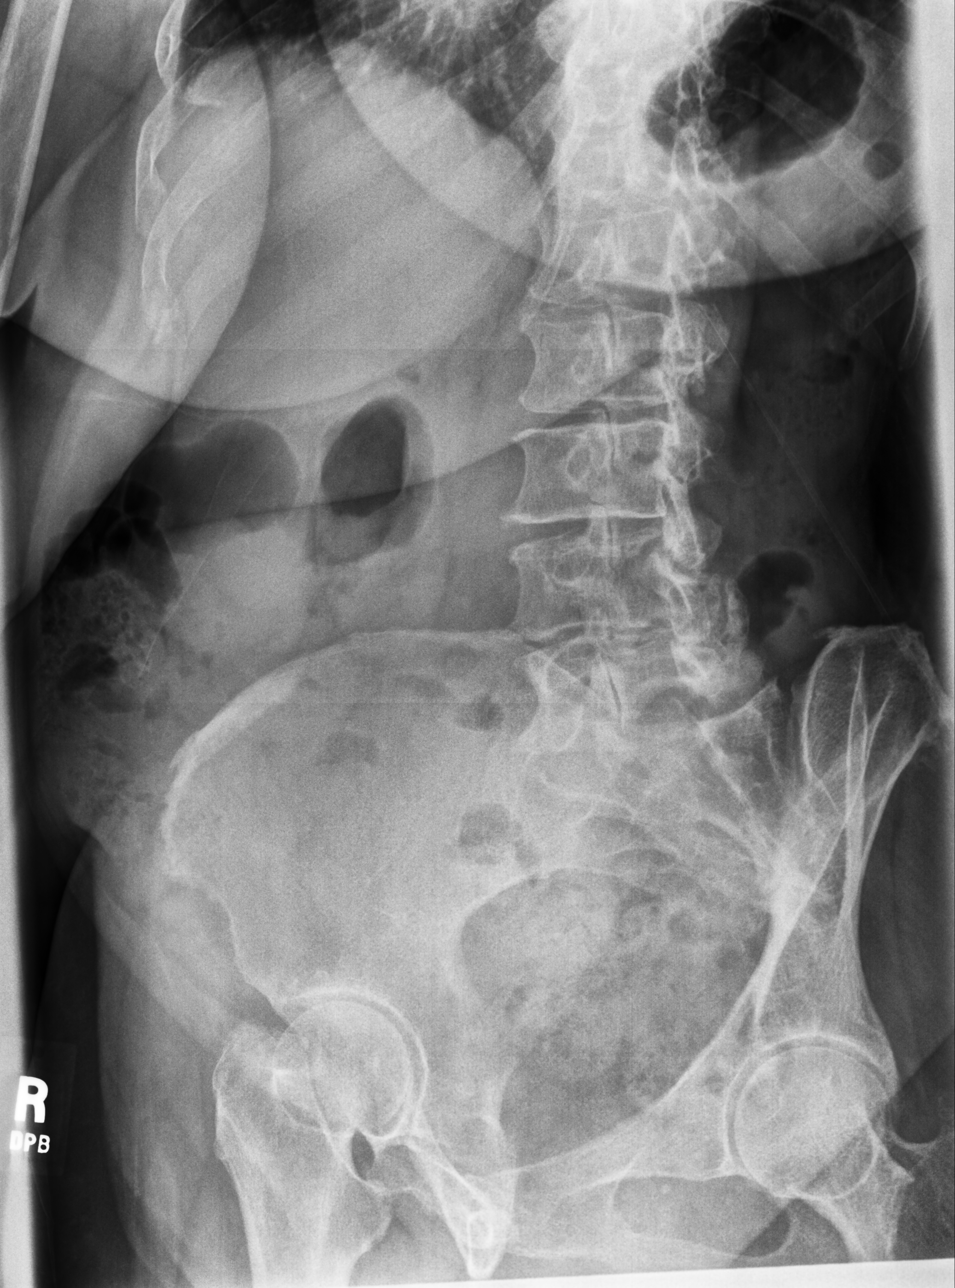

[lumbar spine oblique (2 of 2)]
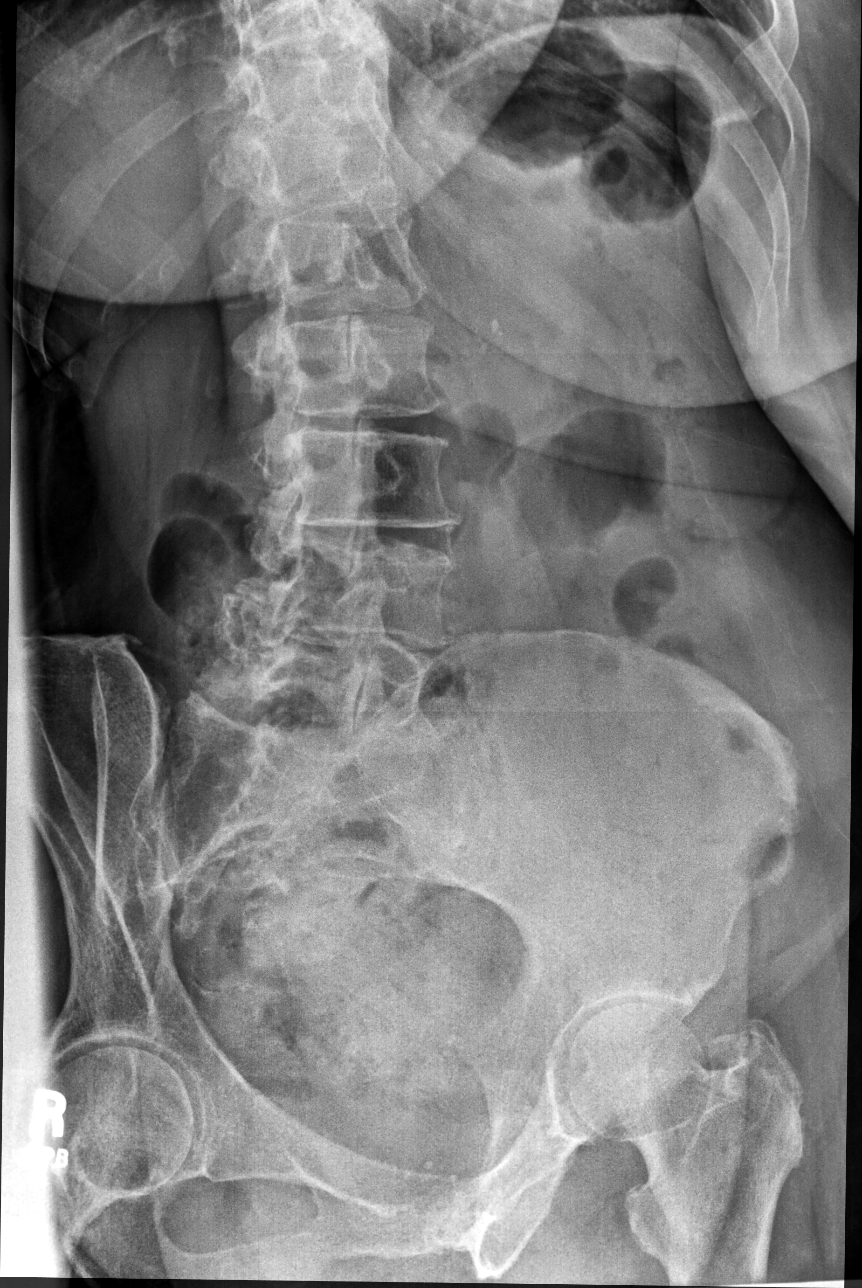

[lumbar spine lat (1 of 2)]
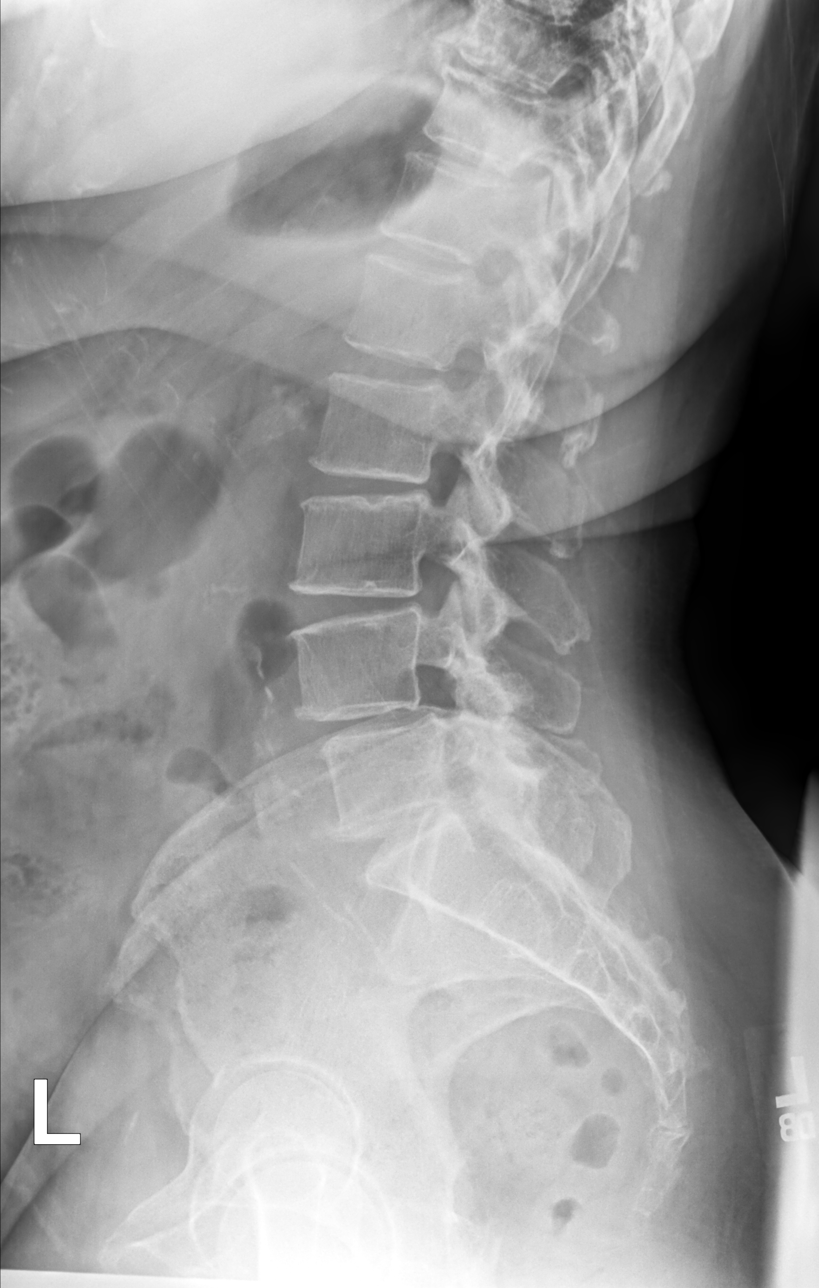

[lumbar spine lat (2 of 2)]
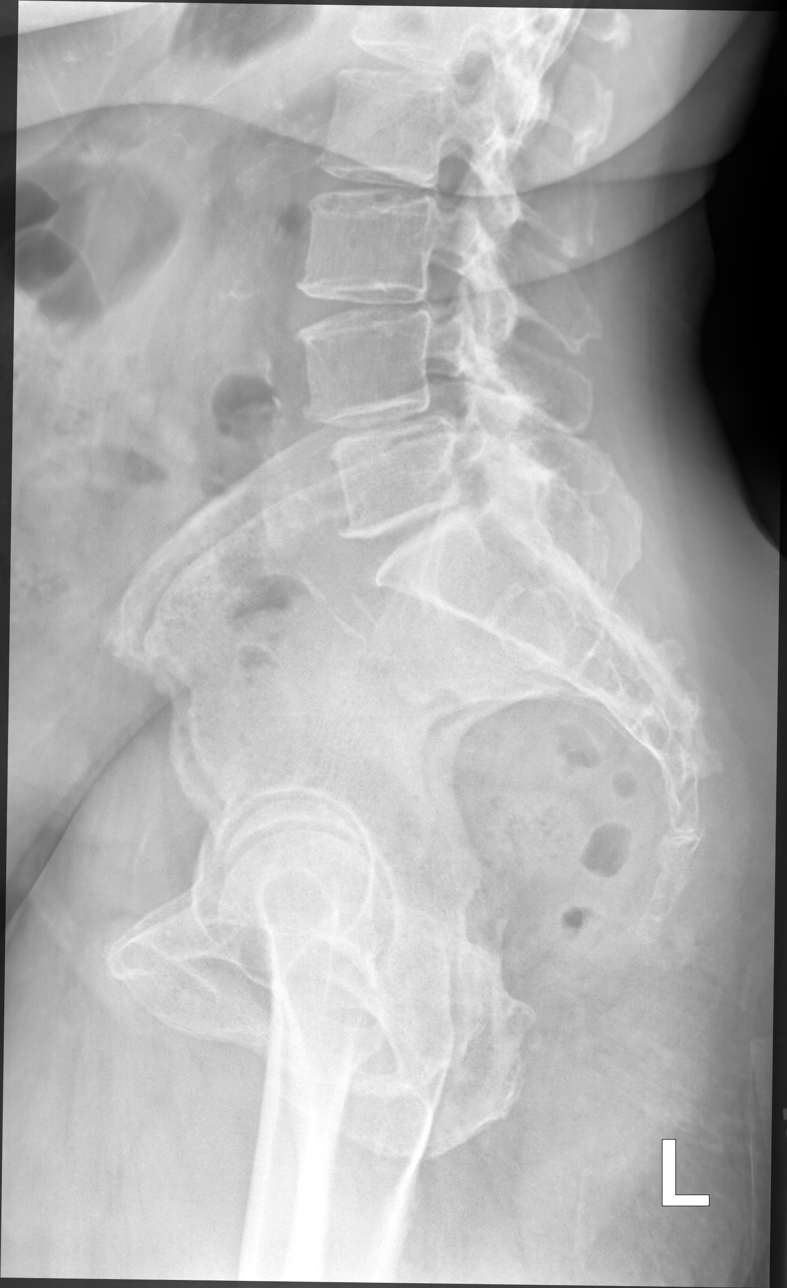

[5 of 5 positions shown; findings below may reference images not displayed]

FINDINGS: There are 5 lumbar type vertebral bodies. There is 6 mm of
degenerative anterolisthesis at L4-5. The alignment is otherwise
normal. No evidence of acute fracture or pars defect. There is mild
multilevel disc space narrowing, most advanced at L4-5. Facet
degenerative changes are present inferiorly. There is aortoiliac
atherosclerosis.
IMPRESSION: Multilevel spondylosis with grade 1 degenerative anterolisthesis at
L4-5. No acute osseous findings.

## 2021-07-29 ENCOUNTER — Encounter: Payer: Self-pay | Admitting: Family

## 2021-07-29 ENCOUNTER — Other Ambulatory Visit: Payer: Self-pay

## 2021-07-29 ENCOUNTER — Ambulatory Visit (INDEPENDENT_AMBULATORY_CARE_PROVIDER_SITE_OTHER): Payer: Medicare HMO | Admitting: Family

## 2021-07-29 VITALS — BP 137/82 | HR 91 | Temp 97.7°F | Ht <= 58 in | Wt 151.0 lb

## 2021-07-29 DIAGNOSIS — R21 Rash and other nonspecific skin eruption: Secondary | ICD-10-CM | POA: Diagnosis not present

## 2021-07-29 MED ORDER — TRIAMCINOLONE ACETONIDE 0.5 % EX OINT: 1.0000 "application " | TOPICAL_OINTMENT | Freq: Two times a day (BID) | CUTANEOUS | 0 refills | Status: DC

## 2021-07-29 MED ORDER — METHYLPREDNISOLONE ACETATE 80 MG/ML IJ SUSP
80.0000 mg | Freq: Once | INTRAMUSCULAR | Status: AC
  Administered 2021-07-29: 80 mg via INTRAMUSCULAR

## 2021-07-29 MED ORDER — VALACYCLOVIR HCL 1 G PO TABS: ORAL_TABLET | ORAL | 2 refills | Status: DC

## 2021-07-29 NOTE — Assessment & Plan Note (Signed)
Left arm, excoriated from scratching, has moved to back, appears as diffuse pimples all over top of back to middle of back, describes it as burning pain and itching. Reports getting shingrix vaccine, one shot in Sept., previous shot was in 2020? Treating as shingles. Given steroid inj. And Valtrex. Pt advised on use & SE. Call office if not resolving.

## 2021-07-29 NOTE — Patient Instructions (Signed)
It was very nice to see you today!  I have sent an ointment to apply to your rash and a medication to take twice a day for a week. Call the office if your rash is not resolving.  PLEASE NOTE:  If you had any lab tests please let us know if you have not heard back within a few days. You may see your results on MyChart before we have a chance to review them but we will give you a call once they are reviewed by [place]. If we ordered any referrals today, please let us know if you have not heard from their office within the next week.   Please try these tips to maintain a healthy lifestyle:  Eat most of your calories during the day when you are active. Eliminate processed foods including packaged sweets (pies, cakes, cookies), reduce intake of potatoes, white bread, white pasta, and white rice. Look for whole grain options, oat flour or almond flour.  Each meal should contain half fruits/vegetables, one quarter protein, and one quarter carbs (no bigger than a computer mouse).  Cut down on sweet beverages. This includes juice, soda, and sweet tea. Also watch fruit intake, though this is a healthier sweet option, it still contains natural sugar! Limit to 3 servings daily.  Drink at least 1 glass of water with each meal and aim for at least 8 glasses per day  Exercise at least 150 minutes every week.

## 2021-07-29 NOTE — Progress Notes (Signed)
Subjective:     Patient ID: Kathleen Morse, female    DOB: Jan 18, 1951, 70 y.o.   MRN: [id number]  Chief Complaint  Patient presents with   Rash    Started with the left arm and has spread to her back, neck and shoulders. Noticing 1 month ago. She has used OTC hydrocortisone but has not subsided.She denies pain.    HPI  Rash: Patient complains of rash involving the left arm, back, and trunk. Rash started 1 month ago. Appearance of rash at onset: pimple-like. Rash has not changed over time Initial distribution: left arm.  Discomfort associated with rash: is painful and is pruritic.  Associated symptoms: none.  Patient has not had previous evaluation of rash. Patient has not had previous treatment.  Patient has not had contacts with similar rash. Patient has not identified precipitant. Patient has not had new exposures (soaps, lotions, laundry detergents, foods, medications, plants, insects or animals.) Reports having Eczema in past.    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 Recheck 2-3 years.     Past Surgical History:  Procedure Laterality Date   [place]    Outpatient Medications Prior to Visit  Medication Sig Dispense Refill   albuterol (VENTOLIN HFA) 108 (90 Base) MCG/ACT inhaler INHALE 1 TO 2 PUFFS INTO THE LUNGS EVERY 6 HOURS AS NEEDED FOR WHEEZING OR SHORTNESS OF BREATH. 1 each 1   amLODipine (NORVASC) 10 MG tablet TAKE 1 TABLET EVERY DAY 90 tablet 3   b complex vitamins capsule Take 1 capsule by mouth daily.     chlorthalidone (HYGROTON) 25 MG tablet Take 1 tablet (25 mg total) by mouth daily. 90 tablet 3   co-enzyme  Q-10 30 MG capsule Take 30 mg by mouth 3 (three) times daily.     diclofenac Sodium (VOLTAREN) 1 % GEL Apply 2 g topically 4 (four) times daily as needed. 350 g 5   Fluticasone Propionate, Inhal, (FLOVENT DISKUS) 100 MCG/BLIST AEPB Inhale 2 puffs into the lungs 2 (two) times daily. 60 each 11   latanoprost (XALATAN) 0.005 % ophthalmic solution Place 1 drop into both eyes at bedtime. 2.5 mL 2   montelukast (SINGULAIR) 10 MG tablet Take 1 tablet (10 mg total) by mouth at bedtime. 90 tablet 3   omeprazole (PRILOSEC) 20 MG capsule Take 1 capsule (20 mg total) by mouth daily. Twice a day 90 capsule 1   rosuvastatin (CRESTOR) 20 MG tablet Take 1 tablet (20 mg total) by mouth at bedtime. 90 tablet 3   triamcinolone cream (KENALOG) 0.1 % APPLY 1 APPLICATION TOPICALLY 2 (TWO) TIMES DAILY FOR 2 WEEKS, THEN AS NEEDED 30 g 1   PFIZER-BIONT COVID-19 VAC-TRIS SUSP injection  (Patient not taking: Reported on 07/29/2021)     No facility-administered medications prior to visit.    Allergies  Allergen Reactions   Penicillins Rash        Objective:    Physical Exam Vitals and nursing note reviewed.  Constitutional:      Appearance: Normal appearance.  Cardiovascular:     Rate and Rhythm: Normal rate and regular rhythm.  Pulmonary:  Effort: Pulmonary effort is normal.     Breath sounds: Normal breath sounds.  Musculoskeletal:        General: Normal range of motion.  Skin:    General: Skin is warm and dry.     Findings: Lesion and rash present. Rash is pustular and vesicular.       Neurological:     Mental Status: She is alert.  Psychiatric:        Mood and Affect: Mood normal.        Behavior: Behavior normal.    BP 137/82   Pulse 91   Temp 97.7 F (36.5 C) (Temporal)   Ht 4\' 10"  (1.473 m)   Wt 151 lb (68.5 kg)   SpO2 98%   BMI 31.56 kg/m  Wt Readings from Last 3 Encounters:  07/29/21 151 lb (68.5 kg)  06/19/21 150 lb 9.6 oz (68.3 kg)  03/07/21 151 lb 9.6 oz (68.8 kg)        Assessment & Plan:   Problem List Items Addressed This Visit       Musculoskeletal and Integument   Skin rash - Primary    Left arm, excoriated from scratching, has moved to back, appears as diffuse pimples all over top of back to middle of back, describes it as burning pain and itching. Reports getting shingrix vaccine, one shot in Sept., previous shot was in 2020? Treating as shingles. Given steroid inj. And Valtrex. Pt advised on use & SE. Call office if not resolving.      Relevant Medications   valACYclovir (VALTREX) 1000 MG tablet   triamcinolone ointment (KENALOG) 0.5 %    Meds ordered this encounter  Medications   methylPREDNISolone acetate (DEPO-MEDROL) injection 80 mg   valACYclovir (VALTREX) 1000 MG tablet    Sig: Take two tablets ( total 2000 mg) by mouth q12h x 1 day; Start: ASAP after symptom onset    Dispense:  6 tablet    Refill:  2    Order Specific Question:   Supervising Provider    Answer:   ANDY, CAMILLE L [[id number]]   triamcinolone ointment (KENALOG) 0.5 %    Sig: Apply 1 application topically 2 (two) times daily.    Dispense:  30 g    Refill:  0    Order Specific Question:   Supervising Provider    Answer:   ANDY, CAMILLE L [[id number]]

## 2021-08-05 ENCOUNTER — Telehealth: Payer: Self-pay

## 2021-08-05 ENCOUNTER — Other Ambulatory Visit: Payer: Self-pay | Admitting: Family

## 2021-08-05 DIAGNOSIS — R21 Rash and other nonspecific skin eruption: Secondary | ICD-10-CM

## 2021-08-05 MED ORDER — VALACYCLOVIR HCL 1 G PO TABS: 1000.0000 mg | ORAL_TABLET | Freq: Two times a day (BID) | ORAL | 0 refills | Status: DC

## 2021-08-05 NOTE — Telephone Encounter (Signed)
Per her chart, her vaccine was in 2 months ago, don't think this was a reaction. But may have been ineffective as it was 2 years after her first dose, and now having a breakout of shingles. I have resent the Valacyclovir (Valtrex) for 7 days, 1 pill twice a day. Did she get the steroid cream to use on her rash? Triamcinolone - let me know, thanks.

## 2021-08-05 NOTE — Telephone Encounter (Signed)
Patients husband called in and stated the patient has ran of medication and would like more sent in    valACYclovir (VALTREX) 1000 MG tablet      Sig: Take two tablets ( total 2000 mg) by mouth q12h x 1 day; Start: ASAP after symptom onset      Dispense:  6 tablet      Refill:  2      Order Specific Question:   Supervising Provider      Answer:   ANDY, CAMILLE L [[id number]]   triamcinolone ointment (KENALOG) 0.5 %      Sig: Apply 1 application topically 2 (two) times daily.      Dispense:  30 g      Refill:  0      Order Specific Question:   Supervising Provider      Answer:   ANDY, CAMILLE L [[id number]]

## 2021-08-05 NOTE — Telephone Encounter (Signed)
Gave pt message below, she appreciates the phone call and agrees with the plan to pick up medication from pharmacy. Pharmacy verified.

## 2021-08-05 NOTE — Telephone Encounter (Signed)
Caller says that the med she was prescribed for shingles but the med is not working and it is very painful.

## 2021-08-05 NOTE — Telephone Encounter (Signed)
Please see message below

## 2021-08-05 NOTE — Telephone Encounter (Signed)
---  Caller states wife had a breakout after shingles shot. Given a prescription for pills. Pharmacy gave incorrect dose of pills and needs a refill. Rash is worsening and on entire back and under arms  Does the patient have any new or worsening symptoms? ---Yes Will a triage be completed? ---Yes Related visit to physician within the last 2 weeks? ---No Does the PT have any chronic conditions? (i.e. diabetes, asthma, this includes High risk factors for pregnancy, etc.) ---No Is this a behavioral health or substance abuse call? ---No  Immunization Reactions  Sounds like a severe, unusual reaction to the triager Doy Mince, RN, [place] Ginger 08/02/2021 10:29:15 AM  08/02/2021 10:34:08 AM Go to ED Now (or PCP triage) Yes Doy Mince, RN, [place] Ginger

## 2021-08-05 NOTE — Telephone Encounter (Signed)
Please advise 

## 2021-08-20 ENCOUNTER — Other Ambulatory Visit: Payer: Self-pay

## 2021-08-20 ENCOUNTER — Emergency Department (HOSPITAL_BASED_OUTPATIENT_CLINIC_OR_DEPARTMENT_OTHER)
Admission: EM | Admit: 2021-08-20 | Discharge: 2021-08-21 | Disposition: A | Discharge status: Home / Self Care | Payer: Medicare HMO | Attending: Emergency Medicine | Admitting: Emergency Medicine

## 2021-08-20 ENCOUNTER — Encounter (HOSPITAL_BASED_OUTPATIENT_CLINIC_OR_DEPARTMENT_OTHER): Payer: Self-pay | Admitting: Radiology

## 2021-08-20 DIAGNOSIS — Z87891 Personal history of nicotine dependence: Secondary | ICD-10-CM | POA: Diagnosis not present

## 2021-08-20 DIAGNOSIS — T783XXA Angioneurotic edema, initial encounter: Secondary | ICD-10-CM | POA: Insufficient documentation

## 2021-08-20 DIAGNOSIS — I10 Essential (primary) hypertension: Secondary | ICD-10-CM | POA: Diagnosis not present

## 2021-08-20 DIAGNOSIS — Z79899 Other long term (current) drug therapy: Secondary | ICD-10-CM | POA: Diagnosis not present

## 2021-08-20 DIAGNOSIS — J4540 Moderate persistent asthma, uncomplicated: Secondary | ICD-10-CM | POA: Insufficient documentation

## 2021-08-20 DIAGNOSIS — R220 Localized swelling, mass and lump, head: Secondary | ICD-10-CM | POA: Diagnosis present

## 2021-08-20 DIAGNOSIS — R030 Elevated blood-pressure reading, without diagnosis of hypertension: Secondary | ICD-10-CM | POA: Diagnosis not present

## 2021-08-20 MED ORDER — METHYLPREDNISOLONE SODIUM SUCC 125 MG IJ SOLR
125.0000 mg | Freq: Once | INTRAMUSCULAR | Status: AC
  Administered 2021-08-20: 125 mg via INTRAVENOUS
  Filled 2021-08-20: qty 2

## 2021-08-20 MED ORDER — FAMOTIDINE IN NACL 20-0.9 MG/50ML-% IV SOLN
20.0000 mg | Freq: Once | INTRAVENOUS | Status: AC
  Administered 2021-08-20: 20 mg via INTRAVENOUS
  Filled 2021-08-20: qty 50

## 2021-08-20 MED ORDER — DIPHENHYDRAMINE HCL 50 MG/ML IJ SOLN
25.0000 mg | Freq: Once | INTRAMUSCULAR | Status: AC
  Administered 2021-08-20: 25 mg via INTRAVENOUS
  Filled 2021-08-20: qty 1

## 2021-08-20 NOTE — ED Provider Notes (Signed)
[facility] EMERGENCY DEPT Provider Note   CSN: [id number] Arrival date & time: 08/20/21  2238     History Chief Complaint  Patient presents with   Facial Swelling    Kathleen Morse is a 70 y.o. female.  The history is provided by the patient.  She has history of hypertension, hyperlipidemia, asthma, glaucoma and comes in because of swelling of the left side of her face.  She went to sleep tonight and was awakened by the swelling which involved the left side of her upper lip and her left cheek.  There is mild itching but no pain.  She denies any difficulty breathing or swallowing.  She is on medication for hypertension, but denies being on any ACE inhibitor's or ARB's.  She has never had any episodes like this before.  There was no treatment at home.   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 Recheck 2-3 years.     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     OB History   No obstetric history on file.     Family History   Problem Relation Age of Onset   Arthritis Mother    Birth defects Mother    Diabetes Mother    Depression Father    Cancer Sister    Heart attack Maternal Grandmother    Stroke Maternal Grandfather     Social History   Tobacco Use   Smoking status: Former   Smokeless tobacco: Never  Substance Use Topics   Alcohol use: Yes   Drug use: Never    Home Medications Prior to Admission medications   Medication Sig Start Date End Date Taking? Authorizing Provider  albuterol (VENTOLIN HFA) 108 (90 Base) MCG/ACT inhaler INHALE 1 TO 2 PUFFS INTO THE LUNGS EVERY 6 HOURS AS NEEDED FOR WHEEZING OR SHORTNESS OF BREATH. 06/03/21   Leamon Arnt, MD  amLODipine (NORVASC) 10 MG tablet TAKE 1 TABLET EVERY DAY 08/30/20   Leamon Arnt, MD  b complex vitamins capsule Take 1 capsule by mouth daily.    [provider]  chlorthalidone (HYGROTON) 25 MG tablet Take 1 tablet (25 mg total) by mouth daily. 06/27/21   Leamon Arnt, MD  co-enzyme Q-10 30 MG capsule Take 30 mg by mouth 3 (three) times daily.    [provider]  diclofenac Sodium (VOLTAREN) 1 % GEL Apply 2 g topically 4 (four) times daily as needed. 06/25/20   Leamon Arnt, MD  Fluticasone Propionate, Inhal, (FLOVENT DISKUS) 100 MCG/BLIST AEPB Inhale 2 puffs into the lungs 2 (  two) times daily. 06/19/21   Leamon Arnt, MD  latanoprost (XALATAN) 0.005 % ophthalmic solution Place 1 drop into both eyes at bedtime. 06/27/21   Leamon Arnt, MD  montelukast (SINGULAIR) 10 MG tablet Take 1 tablet (10 mg total) by mouth at bedtime. 06/27/21   Leamon Arnt, MD  omeprazole (PRILOSEC) 20 MG capsule Take 1 capsule (20 mg total) by mouth daily. Twice a day 06/19/21   Leamon Arnt, MD  PFIZER-BIONT COVID-19 VAC-TRIS SUSP injection  12/21/20   [provider]  rosuvastatin (CRESTOR) 20 MG tablet Take 1 tablet (20 mg total) by mouth at bedtime. 06/06/21   Leamon Arnt, MD  triamcinolone ointment (KENALOG) 0.5 %  Apply 1 application topically 2 (two) times daily. 07/29/21   Jeanie Sewer, NP  valACYclovir (VALTREX) 1000 MG tablet Take 1 tablet (1,000 mg total) by mouth 2 (two) times daily. 08/05/21   Jeanie Sewer, NP    Allergies    Penicillins  Review of Systems   Review of Systems  All other systems reviewed and are negative.  Physical Exam Updated Vital Signs BP (!) 151/109 (BP Location: Right Arm)    Pulse 94    Temp 98.6 F (37 C)    Resp 18    Ht 4\' 10"  (1.473 m)    Wt 65.8 kg    SpO2 98%    BMI 30.31 kg/m   Physical Exam Vitals and nursing note reviewed.  70 year old female, resting comfortably and in no acute distress. Vital signs are significant for elevated blood pressure. Oxygen saturation is 98%, which is normal. Head is normocephalic and atraumatic. PERRLA, EOMI. Oropharynx is clear.  Angioedema is present involving the left side of the upper lip and into the left malar area.  No involvement of the tongue, sublingual tissue, oropharynx.  No pooling of secretions.  No stridor.  Phonation is normal. Neck is nontender and supple without adenopathy or JVD. Back is nontender and there is no CVA tenderness. Lungs are clear without rales, wheezes, or rhonchi. Chest is nontender. Heart has regular rate and rhythm without murmur. Abdomen is soft, flat, nontender. Extremities have no cyanosis or edema, full range of motion is present. Skin is warm and dry without rash. Neurologic: Mental status is normal, cranial nerves are intact, moves all extremities equally.  ED Results / Procedures / Treatments    Procedures Procedures  CRITICAL CARE Performed by: Delora Fuel Total critical care time: 45 minutes Critical care time was exclusive of separately billable procedures and treating other patients. Critical care was necessary to treat or prevent imminent or life-threatening deterioration. Critical care was time spent personally by me on the following activities: development  of treatment plan with patient and/or surrogate as well as nursing, discussions with consultants, evaluation of patient's response to treatment, examination of patient, obtaining history from patient or surrogate, ordering and performing treatments and interventions, ordering and review of laboratory studies, ordering and review of radiographic studies, pulse oximetry and re-evaluation of patient's condition.  Medications Ordered in ED Medications  methylPREDNISolone sodium succinate (SOLU-MEDROL) 125 mg/2 mL injection 125 mg (125 mg Intravenous Given 08/20/21 2328)  diphenhydrAMINE (BENADRYL) injection 25 mg (25 mg Intravenous Given 08/20/21 2328)  famotidine (PEPCID) IVPB 20 mg premix (0 mg Intravenous Stopped 08/21/21 0013)    ED Course  I have reviewed the triage vital signs and the nursing notes.  MDM Rules/Calculators/A&P  Angioedema involving the left side of the upper lip and the left malar area.  She is not on any medications which typically cause angioedema, no recent medication changes other than chlorthalidone which was started 2 months ago.  Old records are reviewed, and there are no relevant past visits.  She will be given methylprednisolone, diphenhydramine, famotidine and observed in the emergency department.  She was observed for 4 hours during which time swelling decreased significantly.  She is discharged with prescription for prednisone, told to use over-the-counter second-generation antihistamines.  Return precautions discussed.  Final Clinical Impression(s) / ED Diagnoses Final diagnoses:  Angioedema of lips, initial encounter  Elevated blood pressure reading with diagnosis of hypertension    Rx / DC Orders ED Discharge Orders          Ordered    predniSONE (DELTASONE) 50 MG tablet  Daily,   Status:  Discontinued        08/21/21 0309    predniSONE (DELTASONE) 50 MG tablet  Daily        08/21/21 5625             Delora Fuel,  MD 63/89/37 [phone]

## 2021-08-20 NOTE — ED Triage Notes (Addendum)
Pt states she made a salad tonight with the only difference being fennel seasoning around 6:30pm. She then laid down and was woke up by this around 10:25pm. No trouble breathing or swallowing. Left face is visibly swollen.

## 2021-08-21 MED ORDER — PREDNISONE 50 MG PO TABS: 50.0000 mg | ORAL_TABLET | Freq: Every day | ORAL | 0 refills | Status: DC

## 2021-08-21 MED ORDER — PREDNISONE 50 MG PO TABS
50.0000 mg | ORAL_TABLET | Freq: Every day | ORAL | 0 refills | Status: DC
Start: 2021-08-21 — End: 2021-08-21

## 2021-08-21 NOTE — ED Notes (Signed)
Pt resting comfortably at this time. NAD.

## 2021-08-21 NOTE — Discharge Instructions (Signed)
The cause for your swelling is not clear.  If you have recurrence of the swelling, you do need to come to the emergency department to be evaluated.  If swelling area comes back several times, you may need to be evaluated by an allergist.  Please take either cetirizine (Zyrtec) or loratadine (Claritin) once a day for the next 5 days.  You may take diphenhydramine (Benadryl) every 4 hours as needed for itching or swelling.

## 2021-08-21 NOTE — ED Notes (Signed)
Pt denies difficulty with breathing or swallowing at this time. VSS.

## 2021-09-30 ENCOUNTER — Encounter: Payer: Self-pay | Admitting: Family Medicine

## 2021-09-30 ENCOUNTER — Ambulatory Visit (INDEPENDENT_AMBULATORY_CARE_PROVIDER_SITE_OTHER): Payer: Medicare HMO | Admitting: Family Medicine

## 2021-09-30 ENCOUNTER — Other Ambulatory Visit: Payer: Self-pay

## 2021-09-30 ENCOUNTER — Other Ambulatory Visit: Payer: Self-pay | Admitting: Family Medicine

## 2021-09-30 VITALS — BP 140/98 | HR 79 | Temp 97.9°F | Ht <= 58 in | Wt 144.4 lb

## 2021-09-30 DIAGNOSIS — I10 Essential (primary) hypertension: Secondary | ICD-10-CM | POA: Diagnosis not present

## 2021-09-30 DIAGNOSIS — R413 Other amnesia: Secondary | ICD-10-CM

## 2021-09-30 DIAGNOSIS — R630 Anorexia: Secondary | ICD-10-CM

## 2021-09-30 MED ORDER — TRIAMCINOLONE ACETONIDE 0.5 % EX OINT: 1.0000 "application " | TOPICAL_OINTMENT | Freq: Two times a day (BID) | CUTANEOUS | 0 refills | Status: DC | PRN

## 2021-09-30 MED ORDER — OMEPRAZOLE 20 MG PO CPDR: 20.0000 mg | DELAYED_RELEASE_CAPSULE | Freq: Every day | ORAL | 1 refills | Status: DC

## 2021-09-30 MED ORDER — AMLODIPINE BESYLATE 10 MG PO TABS: 10.0000 mg | ORAL_TABLET | Freq: Every day | ORAL | 3 refills | Status: DC

## 2021-09-30 NOTE — Progress Notes (Signed)
Subjective  CC:  Chief Complaint  Patient presents with   Fatigue    Mostly in the mornings. Since she started taking her new BP medications.   Dizziness    When she goes to stand up.    Hypertension    BP has been good at home.    HPI: Kathleen Morse is a 71 y.o. female who presents to the office today to address the problems listed above in the chief complaint. Hypertension f/u: pt is only taking chlorthalidone 25 and says it gives her headaches like the hctz did. She is not taking the amlodipine: for unclear reasons. No cp or sob. Feels tired.  Loss of appetite. Denies pain but stomach "gurgles". No longer on PPI for unclear reasons. Denies melena. Has lost a few pounds. No night sweats.  Admits to some memory loss; + FH of dementia. Husband notices some short term memory issues. Not getting loss or confused on how to do things. Writes things down more to help herself. No neuro sxs.   Assessment  1. Essential hypertension   2. Decreased appetite   3. Memory changes      Plan   Hypertension f/u: BP control is poorly controlled. Stop chlorthalidone due to perceived headaches. Restart amlodipine daily and recheck in office in 4 weeks.  Check lab work and restart GERD PPI to see if helps with appetite. Recheck 4 weeks.  Memory: start memory training. Will need screen.   Education regarding management of these chronic disease states was given. Management strategies discussed on successive visits include dietary and exercise recommendations, goals of achieving and maintaining IBW, and lifestyle modifications aiming for adequate sleep and minimizing stressors.   Follow up: 4 weeks for recheck.  Orders Placed This Encounter  Procedures   CBC with Differential/Platelet   Comprehensive metabolic panel   TSH   N56 and Folate Panel   RPR   Sedimentation rate   Meds ordered this encounter  Medications   amLODipine (NORVASC) 10 MG tablet    Sig: Take 1 tablet (10 mg  total) by mouth daily.    Dispense:  90 tablet    Refill:  3   triamcinolone ointment (KENALOG) 0.5 %    Sig: Apply 1 application topically 2 (two) times daily as needed.    Dispense:  30 g    Refill:  0   DISCONTD: omeprazole (PRILOSEC) 20 MG capsule    Sig: Take 1 capsule (20 mg total) by mouth daily. Twice a day    Dispense:  90 capsule    Refill:  1      BP Readings from Last 3 Encounters:  09/30/21 (!) 140/98  08/21/21 (!) 142/86  07/29/21 137/82   Wt Readings from Last 3 Encounters:  09/30/21 144 lb 6.4 oz (65.5 kg)  08/20/21 145 lb (65.8 kg)  07/29/21 151 lb (68.5 kg)    Lab Results  Component Value Date   CHOL 209 (H) 10/30/2020   CHOL 200 (H) 09/30/2019   Lab Results  Component Value Date   HDL 64.00 10/30/2020   HDL 52 09/30/2019   Lab Results  Component Value Date   LDLCALC 120 (H) 10/30/2020   LDLCALC 126 (H) 09/30/2019   Lab Results  Component Value Date   TRIG 125.0 10/30/2020   TRIG 111 09/30/2019   Lab Results  Component Value Date   CHOLHDL 3 10/30/2020   CHOLHDL 3.8 09/30/2019   No results found for: LDLDIRECT Lab Results  Component  Value Date   CREATININE 0.77 10/30/2020   BUN 10 10/30/2020   NA 138 10/30/2020   K 3.6 10/30/2020   CL 99 10/30/2020   CO2 29 10/30/2020    The 10-year ASCVD risk score (Arnett DK, et al., 2019) is: 14.8%   Values used to calculate the score:     Age: 11 years     Sex: Female     Is Non-Hispanic African American: Yes     Diabetic: No     Tobacco smoker: No     Systolic Blood Pressure: 638 mmHg     Is BP treated: Yes     HDL Cholesterol: 64 mg/dL     Total Cholesterol: 209 mg/dL  I reviewed the patients updated PMH, FH, and SocHx.    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 She has never used smokeless tobacco. She reports current alcohol use. She reports that she does not use drugs.  Current Meds  Medication Sig   albuterol (VENTOLIN HFA) 108 (90 Base) MCG/ACT inhaler INHALE 1 TO 2 PUFFS INTO THE LUNGS EVERY 6 HOURS AS NEEDED FOR WHEEZING OR SHORTNESS OF BREATH.   b complex vitamins capsule Take 1 capsule by mouth daily.   co-enzyme Q-10 30 MG capsule Take 30 mg by mouth 3 (three) times daily.   diclofenac Sodium (VOLTAREN) 1 % GEL Apply 2 g topically 4 (four) times daily as needed.   Fluticasone Propionate, Inhal, (FLOVENT DISKUS) 100 MCG/BLIST AEPB Inhale 2 puffs into the lungs 2 (two) times daily.   latanoprost (XALATAN) 0.005 % ophthalmic solution Place 1 drop into both eyes at bedtime.   montelukast (SINGULAIR) 10 MG tablet Take 1 tablet (10 mg total) by mouth at bedtime.   rosuvastatin (CRESTOR) 20 MG tablet Take 1 tablet (20 mg total) by mouth at bedtime.   [DISCONTINUED] PFIZER-BIONT COVID-19 VAC-TRIS SUSP injection    [DISCONTINUED] triamcinolone ointment (KENALOG) 0.5 % Apply 1 application topically 2 (two) times daily.    Review of Systems: Cardiovascular: negative for chest pain, palpitations, leg swelling, orthopnea Respiratory: negative for SOB, wheezing or persistent cough Gastrointestinal: negative for abdominal  pain Genitourinary: negative for dysuria or gross hematuria  Objective  Vitals: BP (!) 140/98    Pulse 79    Temp 97.9 F (36.6 C) (Temporal)    Ht 4\' 9"  (1.448 m)    Wt 144 lb 6.4 oz (65.5 kg)    SpO2 98%    BMI 31.25 kg/m  General: no acute distress  Psych:  Alert and oriented, normal mood and  affect HEENT:  Normocephalic, atraumatic, supple neck  Cardiovascular:  RRR without murmur. no edema Respiratory:  Good breath sounds bilaterally, CTAB with normal respiratory effort Gastrointestinal: soft, flat abdomen, normal active bowel sounds, no palpable masses, no hepatosplenomegaly, no appreciated hernias, nontender Skin:  Warm, no rashes Neurologic:   Mental status is normal Commons side effects, risks, benefits, and alternatives for medications and treatment plan prescribed today were discussed, and the patient expressed understanding of the given instructions. Patient is instructed to call or message via MyChart if he/she has any questions or concerns regarding our treatment plan. No barriers to understanding were identified. We discussed Red Flag symptoms and signs in detail. Patient expressed understanding regarding what to do in case of urgent or emergency type symptoms.  Medication list was reconciled, printed and provided to the patient in AVS. Patient instructions and summary information was reviewed with the patient as documented in the AVS. This note was prepared with assistance of Dragon voice recognition software. Occasional wrong-word or sound-a-like substitutions may have occurred due to the inherent limitations of voice recognition software  This visit occurred during the SARS-CoV-2 public health emergency.  Safety protocols were in place, including screening questions prior to the visit, additional usage of staff PPE, and extensive cleaning of exam room while observing appropriate contact time as indicated for disinfecting solutions.

## 2021-09-30 NOTE — Patient Instructions (Addendum)
Please return in 4 weeks to recheck blood pressure.   Please take the medications listed on the medication list.   Take amlodipine daily.  Stop the chlorthalidone.  Restart the omeprazole daily for your stomach. This may help your appetite.    If you have any questions or concerns, please don't hesitate to send me a message via MyChart or call the office at [phone]. Thank you for visiting with [place] today! It's our pleasure caring for you.

## 2021-10-01 ENCOUNTER — Other Ambulatory Visit: Payer: Self-pay | Admitting: Family Medicine

## 2021-10-01 MED ORDER — OMEPRAZOLE 20 MG PO CPDR: 20.0000 mg | DELAYED_RELEASE_CAPSULE | Freq: Two times a day (BID) | ORAL | 1 refills | Status: DC

## 2022-01-02 DIAGNOSIS — R6889 Other general symptoms and signs: Secondary | ICD-10-CM | POA: Diagnosis not present

## 2022-01-02 DIAGNOSIS — H401134 Primary open-angle glaucoma, bilateral, indeterminate stage: Secondary | ICD-10-CM | POA: Diagnosis not present

## 2022-01-02 DIAGNOSIS — Z0100 Encounter for examination of eyes and vision without abnormal findings: Secondary | ICD-10-CM | POA: Diagnosis not present

## 2022-01-30 ENCOUNTER — Ambulatory Visit (INDEPENDENT_AMBULATORY_CARE_PROVIDER_SITE_OTHER): Payer: Medicare HMO

## 2022-01-30 VITALS — BP 110/64 | HR 102 | Temp 97.2°F | Wt 144.4 lb

## 2022-01-30 DIAGNOSIS — Z0000 Encounter for general adult medical examination without abnormal findings: Secondary | ICD-10-CM

## 2022-01-30 NOTE — Patient Instructions (Addendum)
Kathleen Morse , Thank you for taking time to come for your Medicare Wellness Visit. I appreciate your ongoing commitment to your health goals. Please review the following plan we discussed and let me know if I can assist you in the future.   Screening recommendations/referrals: Colonoscopy: Done cologuard 01/07/20 repeat every 3 years  Mammogram: Done 02/14/21 repeat every year  Bone Density: Done 12/26/19 repeat every 3 years  Recommended yearly ophthalmology/optometry visit for glaucoma screening and checkup Recommended yearly dental visit for hygiene and checkup  Vaccinations: Influenza vaccine: Done 05/20/21 repeat every year  Pneumococcal vaccine: Up to date Tdap vaccine: Done 07/08/19 repeat every 10 years  Shingles vaccine: Completed 07/08/19, 05/20/21   Covid-19:Completed 4/8, 5/3, 07/29/20, 4/15, 05/20/21  Advanced directives: Please bring a copy of your health care power of attorney and living will to the office at your convenience.  Conditions/risks identified: None at this time   Next appointment: Follow up in one year for your annual wellness visit    Preventive Care 65 Years and Older, Female Preventive care refers to lifestyle choices and visits with your health care provider that can promote health and wellness. What does preventive care include? A yearly physical exam. This is also called an annual well check. Dental exams once or twice a year. Routine eye exams. Ask your health care provider how often you should have your eyes checked. Personal lifestyle choices, including: Daily care of your teeth and gums. Regular physical activity. Eating a healthy diet. Avoiding tobacco and drug use. Limiting alcohol use. Practicing safe sex. Taking low-dose aspirin every day. Taking vitamin and mineral supplements as recommended by your health care provider. What happens during an annual well check? The services and screenings done by your health care provider during your annual  well check will depend on your age, overall health, lifestyle risk factors, and family history of disease. Counseling  Your health care provider may ask you questions about your: Alcohol use. Tobacco use. Drug use. Emotional well-being. Home and relationship well-being. Sexual activity. Eating habits. History of falls. Memory and ability to understand (cognition). Work and work Statistician. Reproductive health. Screening  You may have the following tests or measurements: Height, weight, and BMI. Blood pressure. Lipid and cholesterol levels. These may be checked every 5 years, or more frequently if you are over 49 years old. Skin check. Lung cancer screening. You may have this screening every year starting at age 12 if you have a 30-pack-year history of smoking and currently smoke or have quit within the past 15 years. Fecal occult blood test (FOBT) of the stool. You may have this test every year starting at age 64. Flexible sigmoidoscopy or colonoscopy. You may have a sigmoidoscopy every 5 years or a colonoscopy every 10 years starting at age 16. Hepatitis C blood test. Hepatitis B blood test. Sexually transmitted disease (STD) testing. Diabetes screening. This is done by checking your blood sugar (glucose) after you have not eaten for a while (fasting). You may have this done every 1-3 years. Bone density scan. This is done to screen for osteoporosis. You may have this done starting at age 103. Mammogram. This may be done every 1-2 years. Talk to your health care provider about how often you should have regular mammograms. Talk with your health care provider about your test results, treatment options, and if necessary, the need for more tests. Vaccines  Your health care provider may recommend certain vaccines, such as: Influenza vaccine. This is recommended every year.  Tetanus, diphtheria, and acellular pertussis (Tdap, Td) vaccine. You may need a Td booster every 10 years. Zoster  vaccine. You may need this after age 55. Pneumococcal 13-valent conjugate (PCV13) vaccine. One dose is recommended after age 85. Pneumococcal polysaccharide (PPSV23) vaccine. One dose is recommended after age 67. Talk to your health care provider about which screenings and vaccines you need and how often you need them. This information is not intended to replace advice given to you by your health care provider. Make sure you discuss any questions you have with your health care provider. Document Released: 09/21/2015 Document Revised: 05/14/2016 Document Reviewed: 06/26/2015 Elsevier Interactive Patient Education  2017 [employer] Prevention in the Home Falls can cause injuries. They can happen to people of all ages. There are many things you can do to make your home safe and to help prevent falls. What can I do on the outside of my home? Regularly fix the edges of walkways and driveways and fix any cracks. Remove anything that might make you trip as you walk through a door, such as a raised step or threshold. Trim any bushes or trees on the path to your home. Use bright outdoor lighting. Clear any walking paths of anything that might make someone trip, such as rocks or tools. Regularly check to see if handrails are loose or broken. Make sure that both sides of any steps have handrails. Any raised decks and porches should have guardrails on the edges. Have any leaves, snow, or ice cleared regularly. Use sand or salt on walking paths during winter. Clean up any spills in your garage right away. This includes oil or grease spills. What can I do in the bathroom? Use night lights. Install grab bars by the toilet and in the tub and shower. Do not use towel bars as grab bars. Use non-skid mats or decals in the tub or shower. If you need to sit down in the shower, use a plastic, non-slip stool. Keep the floor dry. Clean up any water that spills on the floor as soon as it happens. Remove  soap buildup in the tub or shower regularly. Attach bath mats securely with double-sided non-slip rug tape. Do not have throw rugs and other things on the floor that can make you trip. What can I do in the bedroom? Use night lights. Make sure that you have a light by your bed that is easy to reach. Do not use any sheets or blankets that are too big for your bed. They should not hang down onto the floor. Have a firm chair that has side arms. You can use this for support while you get dressed. Do not have throw rugs and other things on the floor that can make you trip. What can I do in the kitchen? Clean up any spills right away. Avoid walking on wet floors. Keep items that you use a lot in easy-to-reach places. If you need to reach something above you, use a strong step stool that has a grab bar. Keep electrical cords out of the way. Do not use floor polish or wax that makes floors slippery. If you must use wax, use non-skid floor wax. Do not have throw rugs and other things on the floor that can make you trip. What can I do with my stairs? Do not leave any items on the stairs. Make sure that there are handrails on both sides of the stairs and use them. Fix handrails that are broken or loose.  Make sure that handrails are as long as the stairways. Check any carpeting to make sure that it is firmly attached to the stairs. Fix any carpet that is loose or worn. Avoid having throw rugs at the top or bottom of the stairs. If you do have throw rugs, attach them to the floor with carpet tape. Make sure that you have a light switch at the top of the stairs and the bottom of the stairs. If you do not have them, ask someone to add them for you. What else can I do to help prevent falls? Wear shoes that: Do not have high heels. Have rubber bottoms. Are comfortable and fit you well. Are closed at the toe. Do not wear sandals. If you use a stepladder: Make sure that it is fully opened. Do not climb a  closed stepladder. Make sure that both sides of the stepladder are locked into place. Ask someone to hold it for you, if possible. Clearly mark and make sure that you can see: Any grab bars or handrails. First and last steps. Where the edge of each step is. Use tools that help you move around (mobility aids) if they are needed. These include: Canes. Walkers. Scooters. Crutches. Turn on the lights when you go into a dark area. Replace any light bulbs as soon as they burn out. Set up your furniture so you have a clear path. Avoid moving your furniture around. If any of your floors are uneven, fix them. If there are any pets around you, be aware of where they are. Review your medicines with your doctor. Some medicines can make you feel dizzy. This can increase your chance of falling. Ask your doctor what other things that you can do to help prevent falls. This information is not intended to replace advice given to you by your health care provider. Make sure you discuss any questions you have with your health care provider. Document Released: 06/21/2009 Document Revised: 01/31/2016 Document Reviewed: 09/29/2014 Elsevier Interactive Patient Education  2017 [employer].

## 2022-01-30 NOTE — Progress Notes (Addendum)
Subjective:   Kathleen Morse is a 71 y.o. female who presents for Medicare Annual (Subsequent) preventive examination.  Review of Systems     Cardiac Risk Factors include: advanced age (>17mn, >>45women);obesity (BMI >30kg/m2);dyslipidemia;hypertension     Objective:    Today's Vitals   01/30/22 0907  BP: 110/64  Pulse: (!) 102  Temp: (!) 97.2 F (36.2 C)  SpO2: 98%  Weight: 144 lb 6.4 oz (65.5 kg)   Body mass index is 31.25 kg/m.     01/30/2022    9:16 AM 01/24/2021    8:58 AM 11/13/2020    1:04 PM 12/19/2019    1:43 PM  Advanced Directives  Does Patient Have a Medical Advance Directive? Yes Yes Yes No  Type of Advance Directive Healthcare Power of [place]  Does patient want to make changes to medical advance directive?   No - Patient declined   Copy of [place] Chart? No - copy requested No - copy requested No - copy requested   Would patient like information on creating a medical advance directive?    Yes (MAU/Ambulatory/Procedural Areas - Information given)    Current Medications (verified) Outpatient Encounter Medications as of 01/30/2022  Medication Sig   albuterol (VENTOLIN HFA) 108 (90 Base) MCG/ACT inhaler INHALE 1 TO 2 PUFFS INTO THE LUNGS EVERY 6 HOURS AS NEEDED FOR WHEEZING OR SHORTNESS OF BREATH.   amLODipine (NORVASC) 10 MG tablet Take 1 tablet (10 mg total) by mouth daily.   b complex vitamins capsule Take 1 capsule by mouth daily.   chlorthalidone (HYGROTON) 25 MG tablet Take 25 mg by mouth daily.   co-enzyme Q-10 30 MG capsule Take 30 mg by mouth 3 (three) times daily.   diclofenac (VOLTAREN) 75 MG EC tablet Take 75 mg by mouth 2 (two) times daily.   diclofenac Sodium (VOLTAREN) 1 % GEL Apply 2 g topically 4 (four) times daily as needed.   Fluticasone Propionate, Inhal, (FLOVENT DISKUS) 100 MCG/BLIST AEPB Inhale 2 puffs into the lungs 2 (two) times daily.    latanoprost (XALATAN) 0.005 % ophthalmic solution Place 1 drop into both eyes at bedtime.   montelukast (SINGULAIR) 10 MG tablet Take 1 tablet (10 mg total) by mouth at bedtime.   omeprazole (PRILOSEC) 20 MG capsule Take 1 capsule (20 mg total) by mouth in the morning and at bedtime.   rosuvastatin (CRESTOR) 20 MG tablet Take 1 tablet (20 mg total) by mouth at bedtime.   triamcinolone ointment (KENALOG) 0.5 % Apply 1 application topically 2 (two) times daily as needed.   No facility-administered encounter medications on file as of 01/30/2022.    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 Recheck 2-3 years.    Past Surgical History:  Procedure Laterality Date   ABDOMINAL HYSTERECTOMY  1983   CESAREAN SECTION  1971   Family History  Problem Relation Age of Onset   Arthritis Mother    Birth defects Mother    Diabetes Mother    Depression Father    Cancer Sister    Heart  attack Maternal Grandmother    Stroke Maternal Grandfather    Social History   Socioeconomic History   Marital status: Married    Spouse name: Not on file   Number of children: 1   Years of education: Not on file   Highest education level: Not on file  Occupational History   Occupation: Accountant retired  Tobacco Use   Smoking status: Former   Smokeless tobacco: Never  Substance and Sexual Activity   Alcohol use: Yes   Drug use: Never   Sexual activity: Not on file  Other Topics Concern   Not on file  Social History Narrative   Not on file   Social Determinants of Health   Financial Resource Strain: Low Risk    Difficulty of Paying Living  Expenses: Not hard at all  Food Insecurity: No Food Insecurity   Worried About Charity fundraiser in the Last Year: Never true   Arboriculturist in the Last Year: Never true  Transportation Needs: No Transportation Needs   Lack of Transportation (Medical): No   Lack of Transportation (Non-Medical): No  Physical Activity: Insufficiently Active   Days of Exercise per Week: 1 day   Minutes of Exercise per Session: 30 min  Stress: No Stress Concern Present   Feeling of Stress : Not at all  Social Connections: Moderately Isolated   Frequency of Communication with Friends and Family: More than three times a week   Frequency of Social Gatherings with Friends and Family: More than three times a week   Attends Religious Services: Never   Marine scientist or Organizations: No   Attends Music therapist: Never   Marital Status: Married    Tobacco Counseling Counseling given: Not Answered   Clinical Intake:  Pre-visit preparation completed: Yes  Pain : No/denies pain     BMI - recorded: 31.25 Nutritional Status: BMI > 30  Obese Nutritional Risks: None Diabetes: No  How often do you need to have someone help you when you read instructions, pamphlets, or other written materials from your doctor or pharmacy?: 1 - Never  Diabetic?no  Interpreter Needed?: No  Information entered by :: Charlott Rakes, LPN   Activities of Daily Living    01/30/2022    9:17 AM  In your present state of health, do you have any difficulty performing the following activities:  Hearing? 0  Vision? 0  Difficulty concentrating or making decisions? 0  Walking or climbing stairs? 0  Dressing or bathing? 0  Doing errands, shopping? 0  Preparing Food and eating ? N  Using the Toilet? N  In the past six months, have you accidently leaked urine? N  Do you have problems with loss of bowel control? N  Managing your Medications? N  Managing your Finances? N  Housekeeping or managing  your Housekeeping? N    Patient Care Team: Leamon Arnt, MD as PCP - General (Family Medicine)  Indicate any recent Medical Services you may have received from other than [facility] providers in the past year (date may be approximate).     Assessment:   This is a routine wellness examination for [place].  Hearing/Vision screen Hearing Screening - Comments:: Pt denies any hearing issues  Vision Screening - Comments:: Pt follows  up with Dr fox for annual eye exams   Dietary issues and exercise activities discussed: Current Exercise Habits: Home exercise routine, Type of exercise: walking, Time (Minutes): 30, Frequency (Times/Week): 1, Weekly Exercise (  Minutes/Week): 30   Goals Addressed             This Visit's Progress    Patient Stated       None at this time        Depression Screen    01/30/2022    9:15 AM 03/07/2021    9:11 AM 01/24/2021    8:56 AM 12/19/2019    1:44 PM 09/30/2019    3:12 PM  PHQ 2/9 Scores  PHQ - 2 Score 0 0 0 0 0    Fall Risk    01/30/2022    9:17 AM 07/29/2021    8:57 AM 03/07/2021    9:11 AM 01/24/2021    8:59 AM 04/09/2020    9:23 AM  Fall Risk   Falls in the past year? 0 0 0 0 0  Number falls in past yr: 0  0 0 0  Injury with Fall? 0  0 0 0  Risk for fall due to : Impaired vision  No Fall Risks Impaired vision   Follow up Falls prevention discussed   Falls prevention discussed     FALL RISK PREVENTION PERTAINING TO THE HOME:  Any stairs in or around the home? Yes  If so, are there any without handrails? No  Home free of loose throw rugs in walkways, pet beds, electrical cords, etc? Yes  Adequate lighting in your home to reduce risk of falls? Yes   ASSISTIVE DEVICES UTILIZED TO PREVENT FALLS:  Life alert? No  Use of a cane, walker or w/c? No  Grab bars in the bathroom? No  Shower chair or bench in shower? No  Elevated toilet seat or a handicapped toilet? No   TIMED UP AND GO:  Was the test performed? Yes .  Length of time to  ambulate 10 feet: 10 sec.   Gait steady and fast without use of assistive device  Cognitive Function:        01/30/2022    9:18 AM 01/24/2021    9:01 AM 12/19/2019    1:43 PM  6CIT Screen  What Year? 0 points 0 points 0 points  What month? 0 points 0 points 0 points  What time? 0 points  0 points  Count back from 20 0 points 0 points 0 points  Months in reverse 0 points 0 points 0 points  Repeat phrase 0 points 10 points 0 points  Total Score 0 points  0 points    Immunizations Immunization History  Administered Date(s) Administered   Fluad Quad(high Dose 65+) 05/22/2020, 05/20/2021   Influenza, High Dose Seasonal PF 06/09/2018   Influenza,inj,Quad PF,6+ Mos 06/08/2019   PFIZER(Purple Top)SARS-COV-2 Vaccination 12/15/2019, 01/09/2020, 07/29/2020, 12/21/2020   Pfizer Covid-19 Vaccine Bivalent Booster 30yr & up 05/20/2021   Pneumococcal Conjugate-13 09/06/2020   Pneumococcal Polysaccharide-23 08/08/2019   Tdap 07/08/2019   Zoster Recombinat (Shingrix) 07/08/2019, 05/20/2021    TDAP status: Up to date  Flu Vaccine status: Up to date  Pneumococcal vaccine status: Up to date  Covid-19 vaccine status: Completed vaccines  Qualifies for Shingles Vaccine? Yes   Zostavax completed Yes   Shingrix Completed?: Yes  Screening Tests Health Maintenance  Topic Date Due   MAMMOGRAM  02/14/2022   INFLUENZA VACCINE  04/08/2022   DEXA SCAN  12/26/2022   Fecal DNA (Cologuard)  01/07/2023   TETANUS/TDAP  07/07/2029   Pneumonia Vaccine 70 Years old  Completed   COVID-19 Vaccine  Completed   Hepatitis C Screening  Completed  Zoster Vaccines- Shingrix  Completed   HPV VACCINES  Aged Out    Health Maintenance  There are no preventive care reminders to display for this patient.  Colorectal cancer screening: Type of screening: Cologuard. Completed 01/07/20. Repeat every 3 years  Mammogram status: Completed 02/14/21. Repeat every year  Bone Density status: Completed 12/26/19.  Results reflect: Bone density results: OSTEOPENIA. Repeat every 3 years.   Additional Screening:  Hepatitis C Screening:  Completed 09/30/19  Vision Screening: Recommended annual ophthalmology exams for early detection of glaucoma and other disorders of the eye. Is the patient up to date with their annual eye exam?  Yes  Who is the provider or what is the name of the office in which the patient attends annual eye exams? [facility] If pt is not established with a provider, would they like to be referred to a provider to establish care? No .   Dental Screening: Recommended annual dental exams for proper oral hygiene  Community Resource Referral / Chronic Care Management: CRR required this visit?  No   CCM required this visit?  No      Plan:     I have personally reviewed and noted the following in the patient's chart:   Medical and social history Use of alcohol, tobacco or illicit drugs  Current medications and supplements including opioid prescriptions.  Functional ability and status Nutritional status Physical activity Advanced directives List of other physicians Hospitalizations, surgeries, and ER visits in previous 12 months Vitals Screenings to include cognitive, depression, and falls Referrals and appointments  In addition, I have reviewed and discussed with patient certain preventive protocols, quality metrics, and best practice recommendations. A written personalized care plan for preventive services as well as general preventive health recommendations were provided to patient.     Willette Brace, LPN   [id number]   Nurse Notes: None

## 2022-04-03 DIAGNOSIS — H401134 Primary open-angle glaucoma, bilateral, indeterminate stage: Secondary | ICD-10-CM | POA: Diagnosis not present

## 2022-04-03 DIAGNOSIS — R6889 Other general symptoms and signs: Secondary | ICD-10-CM | POA: Diagnosis not present

## 2022-05-30 ENCOUNTER — Other Ambulatory Visit: Payer: Self-pay | Admitting: Family Medicine

## 2022-06-04 ENCOUNTER — Encounter: Payer: Self-pay | Admitting: Family Medicine

## 2022-06-04 ENCOUNTER — Ambulatory Visit (INDEPENDENT_AMBULATORY_CARE_PROVIDER_SITE_OTHER): Payer: Medicare HMO | Admitting: Family Medicine

## 2022-06-04 VITALS — BP 108/76 | HR 110 | Temp 98.0°F | Ht <= 58 in | Wt 144.6 lb

## 2022-06-04 DIAGNOSIS — E669 Obesity, unspecified: Secondary | ICD-10-CM

## 2022-06-04 DIAGNOSIS — Z1231 Encounter for screening mammogram for malignant neoplasm of breast: Secondary | ICD-10-CM

## 2022-06-04 DIAGNOSIS — Z23 Encounter for immunization: Secondary | ICD-10-CM | POA: Diagnosis not present

## 2022-06-04 DIAGNOSIS — R413 Other amnesia: Secondary | ICD-10-CM

## 2022-06-04 DIAGNOSIS — I10 Essential (primary) hypertension: Secondary | ICD-10-CM

## 2022-06-04 DIAGNOSIS — E782 Mixed hyperlipidemia: Secondary | ICD-10-CM | POA: Diagnosis not present

## 2022-06-04 DIAGNOSIS — D170 Benign lipomatous neoplasm of skin and subcutaneous tissue of head, face and neck: Secondary | ICD-10-CM

## 2022-06-04 LAB — CBC WITH DIFFERENTIAL/PLATELET
Basophils Absolute: 0.1 10*3/uL (ref 0.0–0.1)
Basophils Relative: 0.5 % (ref 0.0–3.0)
Eosinophils Absolute: 0.1 10*3/uL (ref 0.0–0.7)
Eosinophils Relative: 0.8 % (ref 0.0–5.0)
HCT: 39.7 % (ref 36.0–46.0)
Hemoglobin: 13.2 g/dL (ref 12.0–15.0)
Lymphocytes Relative: 28 % (ref 12.0–46.0)
Lymphs Abs: 3.1 10*3/uL (ref 0.7–4.0)
MCHC: 33.2 g/dL (ref 30.0–36.0)
MCV: 89.2 fl (ref 78.0–100.0)
Monocytes Absolute: 0.6 10*3/uL (ref 0.1–1.0)
Monocytes Relative: 5.2 % (ref 3.0–12.0)
Neutro Abs: 7.3 10*3/uL (ref 1.4–7.7)
Neutrophils Relative %: 65.5 % (ref 43.0–77.0)
Platelets: 357 10*3/uL (ref 150.0–400.0)
RBC: 4.45 Mil/uL (ref 3.87–5.11)
RDW: 14 % (ref 11.5–15.5)
WBC: 11.1 10*3/uL — ABNORMAL HIGH (ref 4.0–10.5)

## 2022-06-04 LAB — COMPREHENSIVE METABOLIC PANEL
ALT: 26 U/L (ref 0–35)
AST: 25 U/L (ref 0–37)
Albumin: 4.4 g/dL (ref 3.5–5.2)
Alkaline Phosphatase: 65 U/L (ref 39–117)
BUN: 9 mg/dL (ref 6–23)
CO2: 29 mEq/L (ref 19–32)
Calcium: 9.9 mg/dL (ref 8.4–10.5)
Chloride: 101 mEq/L (ref 96–112)
Creatinine, Ser: 0.95 mg/dL (ref 0.40–1.20)
GFR: 60.62 mL/min (ref >60.00)
Glucose, Bld: 128 mg/dL — ABNORMAL HIGH (ref 70–99)
Potassium: 3.6 mEq/L (ref 3.5–5.1)
Sodium: 139 mEq/L (ref 135–145)
Total Bilirubin: 0.4 mg/dL (ref 0.2–1.2)
Total Protein: 7.8 g/dL (ref 6.0–8.3)

## 2022-06-04 LAB — LIPID PANEL
Cholesterol: 178 mg/dL (ref 0–200)
HDL: 61.2 mg/dL (ref >39.00)
LDL Cholesterol: 82 mg/dL (ref 0–99)
NonHDL: 116.96
Total CHOL/HDL Ratio: 3
Triglycerides: 175 mg/dL — ABNORMAL HIGH (ref 0.0–149.0)
VLDL: 35 mg/dL (ref 0.0–40.0)

## 2022-06-04 LAB — HEMOGLOBIN A1C: Hgb A1c MFr Bld: 5.7 % (ref 4.6–6.5)

## 2022-06-04 LAB — B12 AND FOLATE PANEL
Folate: >23.9 ng/mL (ref >5.9)
Vitamin B-12: 656 pg/mL (ref 211–911)

## 2022-06-04 LAB — TSH: TSH: 1.9 u[IU]/mL (ref 0.35–5.50)

## 2022-06-04 NOTE — Progress Notes (Signed)
Subjective  CC:  Chief Complaint  Patient presents with   Shoulder Pain    Pt c/o Rt  shoulder pain     HPI: Kathleen Morse is a 71 y.o. female who presents to the office today to address the problems listed above in the chief complaint. 71 year old presents to follow-up chronic medical problems.  Last here in January.  Overdue for lab work.  Reviewed chart. Due mammo. Flu shot today Was last here in January was supposed to follow-up for memory changes.  She is a vague historian.  Admits to mild memory changes but not more specifically. Hypertension on chlorthalidone and amlodipine 10.  Tolerating well.  Blood pressure is very well controlled.  She denies lightheadedness, palpitations or chest pain. Hyperlipidemia overdue for cholesterol recheck.  On Crestor 20.  Tolerates well. Obesity without symptoms of hyperglycemia Has chronic mild neck pain.  Started 5 to 7 years ago.  No change in symptoms.  Just wants the fullness in her right neck to be checked.  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  Assessment  1. Essential hypertension   2. Mixed hyperlipidemia   3. Obesity (BMI 30-39.9)   4. Lipoma of neck   5. Breast cancer screening by mammogram   6. Memory changes   7. Need for immunization against influenza      Plan  Hypertension: Controlled on amlodipine chlorthalidone.  Recheck electrolytes and renal function Hyperlipidemia on statin: Check lipids nonfasting and liver tests Reassured: Neck pain is mild and chronic.  No red flag symptoms.  Lipoma is nontender Recommend mammogram Recommend follow-up in 2 months for complete physical with Mini-Mental status exam at that time.  Check screening labs for vitamin levels and RPR given memory changes Flu shot updated today.  Follow up: Return in about 3 months (around 09/03/2022) for complete physical.  Visit date not found  Orders Placed This Encounter  Procedures   MM DIGITAL SCREENING BILATERAL   Flu Vaccine QUAD High  Dose(Fluad)   B12 and Folate Panel   CBC with Differential/Platelet   Comprehensive metabolic panel   Lipid panel   Hemoglobin A1c   TSH   RPR   No orders of the defined types were placed in this encounter.     I reviewed the patients updated PMH, FH, and SocHx.    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   amLODipine (NORVASC) 10 MG tablet Take 1 tablet (10 mg total)  by mouth daily.   b complex vitamins capsule Take 1 capsule by mouth daily.   chlorthalidone (HYGROTON) 25 MG tablet Take 25 mg by mouth daily.   co-enzyme Q-10 30 MG capsule Take 30 mg by mouth 3 (three) times daily.   diclofenac (VOLTAREN) 75 MG EC tablet TAKE 1 TABLET TWICE DAILY   diclofenac Sodium (VOLTAREN) 1 % GEL Apply 2 g topically 4 (four) times daily as needed.   Fluticasone Propionate, Inhal, (FLOVENT DISKUS) 100 MCG/BLIST AEPB Inhale 2 puffs into the lungs 2 (two) times daily.   latanoprost (XALATAN) 0.005 % ophthalmic solution INSTILL 1 DROP INTO BOTH EYES AT BEDTIME   montelukast  (SINGULAIR) 10 MG tablet Take 1 tablet (10 mg total) by mouth at bedtime.   omeprazole (PRILOSEC) 20 MG capsule Take 1 capsule (20 mg total) by mouth in the morning and at bedtime.   rosuvastatin (CRESTOR) 20 MG tablet Take 1 tablet (20 mg total) by mouth at bedtime.   triamcinolone ointment (KENALOG) 0.5 % Apply 1 application topically 2 (two) times daily as needed.    Allergies: Patient is allergic to pineapple and penicillins. Family History: Patient family history includes Arthritis in her mother; Birth defects in her mother; Cancer in her sister; Depression in her father; Diabetes in her mother; Heart attack in her maternal grandmother; Stroke in her maternal grandfather. Social History:  Patient  reports that she has quit smoking. She has never used smokeless tobacco. She reports current alcohol use. She reports that she does not use drugs.  Review of Systems: Constitutional: Negative for fever malaise or anorexia Cardiovascular: negative for chest pain Respiratory: negative for SOB or persistent cough Gastrointestinal: negative for abdominal pain  Objective  Vitals: BP 108/76   Pulse (!) 110   Temp 98 F (36.7 C)   Ht '4\' 9"'$  (1.448 m)   Wt 144 lb 9.6 oz (65.6 kg)   SpO2 98%   BMI 31.29 kg/m  General: no acute distress , A&Ox3, appears well HEENT: PEERL, conjunctiva normal, neck is supple, left sided supraclavicular mobile nontender 4 to 5 cm mass consistent with lipoma Cardiovascular:  RRR without murmur or gallop.  Respiratory:  Good breath sounds bilaterally, CTAB with normal respiratory effort Skin:  Warm, no rashes No edema    Commons side effects, risks, benefits, and alternatives for medications and treatment plan prescribed today were discussed, and the patient expressed understanding of the given instructions. Patient is instructed to call or message via MyChart if he/she has any questions or concerns regarding our treatment plan. No barriers to understanding were  identified. We discussed Red Flag symptoms and signs in detail. Patient expressed understanding regarding what to do in case of urgent or emergency type symptoms.  Medication list was reconciled, printed and provided to the patient in AVS. Patient instructions and summary information was reviewed with the patient as documented in the AVS. This note was prepared with assistance of Dragon voice recognition software. Occasional wrong-word or sound-a-like substitutions may have occurred due to the inherent limitations of voice recognition software  This visit occurred during the SARS-CoV-2 public health emergency.  Safety protocols were in place, including screening questions prior to the visit, additional usage of staff PPE, and extensive cleaning of exam room while observing appropriate contact time as indicated for disinfecting solutions.

## 2022-06-04 NOTE — Patient Instructions (Addendum)
Please return in 3 months for your annual complete physical; please come fasting.   I will release your lab results to you on your MyChart account with further instructions. You may see the results before I do, but when I review them I will send you a message with my report or have my assistant call you if things need to be discussed. Please reply to my message with any questions. Thank you!   If you have any questions or concerns, please don't hesitate to send me a message via MyChart or call the office at [phone]. Thank you for visiting with us today! It's our pleasure caring for you.   I have ordered a mammogram and/or bone density for you as we discussed today: [x]  Mammogram  []  Bone Density  Please call the office checked below to schedule your appointment:  [x]  [facility]      [address]       [place], [place]        [phone]         []  [facility]  [address]   [place], [place]  [phone]  

## 2022-06-05 LAB — RPR: RPR Ser Ql: NONREACTIVE

## 2022-06-06 ENCOUNTER — Other Ambulatory Visit: Payer: Self-pay | Admitting: Family Medicine

## 2022-07-29 ENCOUNTER — Ambulatory Visit: Payer: Medicare HMO

## 2022-09-08 ENCOUNTER — Other Ambulatory Visit: Payer: Self-pay | Admitting: Family Medicine

## 2022-09-26 ENCOUNTER — Ambulatory Visit
Admission: RE | Admit: 2022-09-26 | Discharge: 2022-09-26 | Disposition: A | Discharge status: Home / Self Care | Payer: Medicare HMO | Source: Ambulatory Visit | Attending: Family Medicine | Admitting: Family Medicine

## 2022-09-26 DIAGNOSIS — Z1231 Encounter for screening mammogram for malignant neoplasm of breast: Secondary | ICD-10-CM

## 2022-09-26 DIAGNOSIS — R6889 Other general symptoms and signs: Secondary | ICD-10-CM | POA: Diagnosis not present

## 2022-11-11 DIAGNOSIS — H40029 Open angle with borderline findings, high risk, unspecified eye: Secondary | ICD-10-CM | POA: Diagnosis not present

## 2022-12-17 ENCOUNTER — Ambulatory Visit (INDEPENDENT_AMBULATORY_CARE_PROVIDER_SITE_OTHER): Payer: Medicare HMO | Admitting: Family Medicine

## 2022-12-17 VITALS — BP 100/80 | HR 56 | Temp 98.6°F | Ht <= 58 in | Wt 144.4 lb

## 2022-12-17 DIAGNOSIS — I10 Essential (primary) hypertension: Secondary | ICD-10-CM | POA: Diagnosis not present

## 2022-12-17 DIAGNOSIS — D170 Benign lipomatous neoplasm of skin and subcutaneous tissue of head, face and neck: Secondary | ICD-10-CM | POA: Diagnosis not present

## 2022-12-17 DIAGNOSIS — K5900 Constipation, unspecified: Secondary | ICD-10-CM

## 2022-12-17 DIAGNOSIS — F552 Abuse of laxatives: Secondary | ICD-10-CM | POA: Insufficient documentation

## 2022-12-17 DIAGNOSIS — G8929 Other chronic pain: Secondary | ICD-10-CM

## 2022-12-17 DIAGNOSIS — F1090 Alcohol use, unspecified, uncomplicated: Secondary | ICD-10-CM | POA: Insufficient documentation

## 2022-12-17 DIAGNOSIS — M545 Low back pain, unspecified: Secondary | ICD-10-CM | POA: Diagnosis not present

## 2022-12-17 LAB — CBC WITH DIFFERENTIAL/PLATELET
Basophils Absolute: 0.1 10*3/uL (ref 0.0–0.1)
Basophils Relative: 0.7 % (ref 0.0–3.0)
Eosinophils Absolute: 0.2 10*3/uL (ref 0.0–0.7)
Eosinophils Relative: 2 % (ref 0.0–5.0)
HCT: 38.5 % (ref 36.0–46.0)
Hemoglobin: 13.1 g/dL (ref 12.0–15.0)
Lymphocytes Relative: 29.8 % (ref 12.0–46.0)
Lymphs Abs: 2.7 10*3/uL (ref 0.7–4.0)
MCHC: 34 g/dL (ref 30.0–36.0)
MCV: 89.2 fl (ref 78.0–100.0)
Monocytes Absolute: 0.7 10*3/uL (ref 0.1–1.0)
Monocytes Relative: 7.4 % (ref 3.0–12.0)
Neutro Abs: 5.3 10*3/uL (ref 1.4–7.7)
Neutrophils Relative %: 60.1 % (ref 43.0–77.0)
Platelets: 368 10*3/uL (ref 150.0–400.0)
RBC: 4.32 Mil/uL (ref 3.87–5.11)
RDW: 13.9 % (ref 11.5–15.5)
WBC: 8.9 10*3/uL (ref 4.0–10.5)

## 2022-12-17 MED ORDER — OMEPRAZOLE 20 MG PO CPDR: 20.0000 mg | DELAYED_RELEASE_CAPSULE | Freq: Every day | ORAL | 1 refills | Status: DC

## 2022-12-17 NOTE — Patient Instructions (Signed)
Please return in 3 months for your annual complete physical; please come fasting.   I will release your lab results to you on your MyChart account with further instructions. You may see the results before I do, but when I review them I will send you a message with my report or have my assistant call you if things need to be discussed. Please reply to my message with any questions. Thank you!   Please take the medications on your medication list as prescribed. Slowly wean off the senekot; see the attached handout. Please go to our [facility] office to get your xrays done. You can walk in M-F between 8:30am- noon or 1pm - 5pm. Tell them you are there for xrays ordered by me. They will send me the results, then I will let you know the results with instructions.   Address: [address].  The Xray department is located in the basement.    If you have any questions or concerns, please don't hesitate to send me a message via MyChart or call the office at [phone]. Thank you for visiting with [place] today! It's our pleasure caring for you.

## 2022-12-17 NOTE — Progress Notes (Signed)
Subjective  CC:  Chief Complaint  Patient presents with   Abdominal Pain    Pt states that she has been having some bloating and when this happen it also make her back hurt as well. Pt states that she does take laxatives and is able to use the bathroom normal. Pt husbands states that this has been going on for about 6 months now     HPI: Kathleen Morse is a 72 y.o. female who presents to the office today to address the problems listed above in the chief complaint. 72 year old female presents with her husband due to problems with low back pain, neck pain and constipation. Low back pain: Chronic in nature.  Has had full workup when [place].  I did x-rays about 2 years ago that showed mild to moderate DJD changes of the lumbar spine.  She does not have radicular symptoms.  She does have daily or almost daily aches and pains.  She uses Aleve about 2-3 times per week this does help.  She has been to physical therapy in the past.  No bowel or bladder dysfunction.  No recent trauma. Uses Senokot nightly for the last several years if not decades.  She tries to have 2 bowel movements per day.  She denies painful bowel movements, hard stool or melena.  She denies abdominal pain.  She admits to belching.  She had been on omeprazole in the past for this which worked well but is no longer taking it.  No heartburn.  She will be due for colorectal cancer screening soon.  Had negative Cologuard about 3 years ago Has large lipoma in right supraclavicular space.  At times this causes a pressure or fullness sensation.  No significant pain. Habitual alcohol use: Patient admits she drinks large amounts of wine in the past.  Now drinks 1-2 bottles per weekend.  Denies complications.  No history of blackouts.  Assessment  1. Chronic midline low back pain without sciatica   2. Essential hypertension   3. Lipoma of neck   4. Laxative habit   5. Constipation, unspecified constipation type   6.  Habitual alcohol use      Plan  Chronic midline low back pain due to arthritis: Education counseling.  Patient defers physical therapy at this time.  He has needed Aleve is helpful.  Will monitor renal function.  No active GERD symptoms now.  No red flag symptoms. Hypertension: Patient reports she is only taking amlodipine, blood pressure is controlled.  Recheck renal function electrolytes.  Took chlorthalidone off the list. Discussed risk of chronic laxative use: Check KUB.  Discussed weaning off laxatives, changing to Metamucil or MiraLAX if needed.  Educated that she does not have to go to the bathroom twice daily.  Monitor for symptoms of symptomatic constipation. Habitual alcohol use: Educated on recommended use for women, 2-3 drinks per week.  Recommend decreasing intake.  Monitor for complications. Start omeprazole 20 daily, history of GERD and belching.  I spent a total of 42 minutes for this patient encounter. Time spent included preparation, face-to-face counseling with the patient and coordination of care, review of chart and records, and documentation of the encounter.  Follow up: 3 months for complete physical 02/05/2023  Orders Placed This Encounter  Procedures   DG Abd 1 View   CBC with Differential/Platelet   Comprehensive metabolic panel   TSH   Meds ordered this encounter  Medications   omeprazole (PRILOSEC) 20 MG capsule  Sig: Take 1 capsule (20 mg total) by mouth daily.    Dispense:  90 capsule    Refill:  1      I reviewed the patients updated PMH, FH, and SocHx.    Patient Active Problem List   Diagnosis Date Noted   Essential hypertension 08/25/2019    Priority: High   Mixed hyperlipidemia 08/25/2019    Priority: High   Spondylosis of cervical region without myelopathy or radiculopathy 04/09/2020    Priority: Medium    Osteopenia after menopause 12/27/2019    Priority: Medium    Glaucoma 09/30/2019    Priority: Medium    Osteoarthritis of lumbar  spine without myelopathy or radiculopathy 09/30/2019    Priority: Medium    Obesity (BMI 30-39.9) 09/30/2019    Priority: Medium    Moderate persistent asthma 08/25/2019    Priority: Medium    Intrinsic eczema 04/09/2020    Priority: Low   Lipoma of neck 04/09/2020    Priority: Low   Chronic midline low back pain without sciatica 09/30/2019    Priority: Low   Habitual alcohol use 12/17/2022   Laxative habit 12/17/2022   Constipation 12/17/2022   Skin rash 07/29/2021   Current Meds  Medication Sig   albuterol (VENTOLIN HFA) 108 (90 Base) MCG/ACT inhaler INHALE 1 TO 2 PUFFS EVERY 6 HOURS AS NEEDED FOR WHEEZING OR SHORTNESS OF BREATH.   amLODipine (NORVASC) 10 MG tablet Take 1 tablet (10 mg total) by mouth daily.   b complex vitamins capsule Take 1 capsule by mouth daily.   diclofenac Sodium (VOLTAREN) 1 % GEL Apply 2 g topically 4 (four) times daily as needed.   latanoprost (XALATAN) 0.005 % ophthalmic solution INSTILL 1 DROP INTO BOTH EYES AT BEDTIME   rosuvastatin (CRESTOR) 20 MG tablet TAKE 1 TABLET (20 MG TOTAL) BY MOUTH AT BEDTIME.   [DISCONTINUED] chlorthalidone (HYGROTON) 25 MG tablet TAKE 1 TABLET (25 MG TOTAL) BY MOUTH DAILY.   [DISCONTINUED] co-enzyme Q-10 30 MG capsule Take 30 mg by mouth 3 (three) times daily.   [DISCONTINUED] diclofenac (VOLTAREN) 75 MG EC tablet TAKE 1 TABLET TWICE DAILY   [DISCONTINUED] Fluticasone Propionate, Inhal, (FLOVENT DISKUS) 100 MCG/BLIST AEPB Inhale 2 puffs into the lungs 2 (two) times daily.   [DISCONTINUED] montelukast (SINGULAIR) 10 MG tablet TAKE 1 TABLET (10 MG TOTAL) BY MOUTH AT BEDTIME.   [DISCONTINUED] omeprazole (PRILOSEC) 20 MG capsule Take 1 capsule (20 mg total) by mouth in the morning and at bedtime.   [DISCONTINUED] triamcinolone ointment (KENALOG) 0.5 % Apply 1 application topically 2 (two) times daily as needed.    Allergies: Patient is allergic to pineapple and penicillins. Family History: Patient family history includes  Arthritis in her mother; Birth defects in her mother; Breast cancer in her sister; Cancer in her sister; Depression in her father; Diabetes in her mother; Heart attack in her maternal grandmother; Stroke in her maternal grandfather. Social History:  Patient  reports that she has quit smoking. She has never used smokeless tobacco. She reports current alcohol use. She reports that she does not use drugs.  Review of Systems: Constitutional: Negative for fever malaise or anorexia Cardiovascular: negative for chest pain Respiratory: negative for SOB or persistent cough Gastrointestinal: negative for abdominal pain  Objective  Vitals: BP 100/80 (BP Location: Left Arm, Patient Position: Sitting, Cuff Size: Normal)   Pulse (!) 56   Temp 98.6 F (37 C) (Oral)   Ht 4\' 9"  (1.448 m)   Wt 144 lb 6  oz (65.5 kg)   SpO2 98%   BMI 31.24 kg/m  General: no acute distress , A&Ox3 HEENT: PEERL, conjunctiva normal, neck is supple, nontender palpable lipoma in right supraclavicular notch Cardiovascular:  RRR without murmur or gallop.  Respiratory:  Good breath sounds bilaterally, CTAB with normal respiratory effort Gastrointestinal: soft, flat abdomen, nontender, normal active bowel sounds, no palpable masses, no hepatosplenomegaly, no appreciated hernias Extremities: No edema Skin:  Warm, no rashes Back: Full range of motion, nontender, negative seated straight leg raise bilaterally  Commons side effects, risks, benefits, and alternatives for medications and treatment plan prescribed today were discussed, and the patient expressed understanding of the given instructions. Patient is instructed to call or message via MyChart if he/she has any questions or concerns regarding our treatment plan. No barriers to understanding were identified. We discussed Red Flag symptoms and signs in detail. Patient expressed understanding regarding what to do in case of urgent or emergency type symptoms.  Medication list was  reconciled, printed and provided to the patient in AVS. Patient instructions and summary information was reviewed with the patient as documented in the AVS. This note was prepared with assistance of Dragon voice recognition software. Occasional wrong-word or sound-a-like substitutions may have occurred due to the inherent limitations of voice recognition software

## 2022-12-19 NOTE — Addendum Note (Signed)
Addended by: Lorn Junes on: 12/19/2022 05:29 PM   Modules accepted: Orders

## 2022-12-24 ENCOUNTER — Other Ambulatory Visit (INDEPENDENT_AMBULATORY_CARE_PROVIDER_SITE_OTHER): Payer: Medicare HMO

## 2022-12-24 DIAGNOSIS — K5900 Constipation, unspecified: Secondary | ICD-10-CM

## 2022-12-24 DIAGNOSIS — I10 Essential (primary) hypertension: Secondary | ICD-10-CM | POA: Diagnosis not present

## 2022-12-24 LAB — COMPREHENSIVE METABOLIC PANEL
ALT: 26 U/L (ref 0–35)
AST: 26 U/L (ref 0–37)
Albumin: 4.3 g/dL (ref 3.5–5.2)
Alkaline Phosphatase: 62 U/L (ref 39–117)
BUN: 10 mg/dL (ref 6–23)
CO2: 28 mEq/L (ref 19–32)
Calcium: 9.7 mg/dL (ref 8.4–10.5)
Chloride: 102 mEq/L (ref 96–112)
Creatinine, Ser: 0.82 mg/dL (ref 0.40–1.20)
GFR: 72.04 mL/min (ref >60.00)
Glucose, Bld: 112 mg/dL — ABNORMAL HIGH (ref 70–99)
Potassium: 3.8 mEq/L (ref 3.5–5.1)
Sodium: 141 mEq/L (ref 135–145)
Total Bilirubin: 0.5 mg/dL (ref 0.2–1.2)
Total Protein: 7.1 g/dL (ref 6.0–8.3)

## 2022-12-24 LAB — TSH: TSH: 2.99 u[IU]/mL (ref 0.35–5.50)

## 2022-12-25 ENCOUNTER — Encounter: Payer: Self-pay | Admitting: Family Medicine

## 2022-12-25 NOTE — Progress Notes (Signed)
Lab results mailed to patient in letter. Normal results. No action / follow up needed on these results.  

## 2023-01-14 ENCOUNTER — Encounter: Payer: Self-pay | Admitting: Physician Assistant

## 2023-02-05 ENCOUNTER — Ambulatory Visit (INDEPENDENT_AMBULATORY_CARE_PROVIDER_SITE_OTHER): Payer: Medicare HMO

## 2023-02-05 VITALS — BP 120/80 | HR 97 | Temp 98.5°F | Wt 147.0 lb

## 2023-02-05 DIAGNOSIS — Z0000 Encounter for general adult medical examination without abnormal findings: Secondary | ICD-10-CM | POA: Diagnosis not present

## 2023-02-05 DIAGNOSIS — E2839 Other primary ovarian failure: Secondary | ICD-10-CM | POA: Diagnosis not present

## 2023-02-05 DIAGNOSIS — Z1211 Encounter for screening for malignant neoplasm of colon: Secondary | ICD-10-CM

## 2023-02-05 NOTE — Patient Instructions (Signed)
[name] , Thank you for taking time to come for your Medicare Wellness Visit. I appreciate your ongoing commitment to your health goals. Please review the following plan we discussed and let me know if I can assist you in the future.   These are the goals we discussed:  Goals      Patient Stated     Lose weight      Patient Stated     None at this time         This is a list of the screening recommended for you and due dates:  Health Maintenance  Topic Date Due   COVID-19 Vaccine (6 - 2023-24 season) 05/09/2022   DEXA scan (bone density measurement)  12/26/2022   Cologuard (Stool DNA test)  01/07/2023   Flu Shot  04/09/2023   Mammogram  09/27/2023   Medicare Annual Wellness Visit  02/05/2024   DTaP/Tdap/Td vaccine (2 - Td or Tdap) 07/07/2029   Pneumonia Vaccine  Completed   Hepatitis C Screening  Completed   Zoster (Shingles) Vaccine  Completed   HPV Vaccine  Aged Out   Colon Cancer Screening  Discontinued    Advanced directives: Advance directive discussed with you today. I have provided a copy for you to complete at home and have notarized. Once this is complete please bring a copy in to our office so we can scan it into your chart.  Conditions/risks identified: none at this time   Next appointment: Follow up in one year for your annual wellness visit    Preventive Care 65 Years and Older, Female Preventive care refers to lifestyle choices and visits with your health care provider that can promote health and wellness. What does preventive care include? A yearly physical exam. This is also called an annual well check. Dental exams once or twice a year. Routine eye exams. Ask your health care provider how often you should have your eyes checked. Personal lifestyle choices, including: Daily care of your teeth and gums. Regular physical activity. Eating a healthy diet. Avoiding tobacco and drug use. Limiting alcohol use. Practicing safe sex. Taking low-dose  aspirin every day. Taking vitamin and mineral supplements as recommended by your health care provider. What happens during an annual well check? The services and screenings done by your health care provider during your annual well check will depend on your age, overall health, lifestyle risk factors, and family history of disease. Counseling  Your health care provider may ask you questions about your: Alcohol use. Tobacco use. Drug use. Emotional well-being. Home and relationship well-being. Sexual activity. Eating habits. History of falls. Memory and ability to understand (cognition). Work and work Astronomer. Reproductive health. Screening  You may have the following tests or measurements: Height, weight, and BMI. Blood pressure. Lipid and cholesterol levels. These may be checked every 5 years, or more frequently if you are over 7 years old. Skin check. Lung cancer screening. You may have this screening every year starting at age 25 if you have a 30-pack-year history of smoking and currently smoke or have quit within the past 15 years. Fecal occult blood test (FOBT) of the stool. You may have this test every year starting at age 33. Flexible sigmoidoscopy or colonoscopy. You may have a sigmoidoscopy every 5 years or a colonoscopy every 10 years starting at age 69. Hepatitis C blood test. Hepatitis B blood test. Sexually transmitted disease (STD) testing. Diabetes screening. This is done by checking your blood sugar (glucose) after you have  not eaten for a while (fasting). You may have this done every 1-3 years. Bone density scan. This is done to screen for osteoporosis. You may have this done starting at age 74. Mammogram. This may be done every 1-2 years. Talk to your health care provider about how often you should have regular mammograms. Talk with your health care provider about your test results, treatment options, and if necessary, the need for more tests. Vaccines  Your  health care provider may recommend certain vaccines, such as: Influenza vaccine. This is recommended every year. Tetanus, diphtheria, and acellular pertussis (Tdap, Td) vaccine. You may need a Td booster every 10 years. Zoster vaccine. You may need this after age 24. Pneumococcal 13-valent conjugate (PCV13) vaccine. One dose is recommended after age 36. Pneumococcal polysaccharide (PPSV23) vaccine. One dose is recommended after age 34. Talk to your health care provider about which screenings and vaccines you need and how often you need them. This information is not intended to replace advice given to you by your health care provider. Make sure you discuss any questions you have with your health care provider. Document Released: 09/21/2015 Document Revised: 05/14/2016 Document Reviewed: 06/26/2015 Elsevier Interactive Patient Education  2017 [employer].  Fall Prevention in the Home Falls can cause injuries. They can happen to people of all ages. There are many things you can do to make your home safe and to help prevent falls. What can I do on the outside of my home? Regularly fix the edges of walkways and driveways and fix any cracks. Remove anything that might make you trip as you walk through a door, such as a raised step or threshold. Trim any bushes or trees on the path to your home. Use bright outdoor lighting. Clear any walking paths of anything that might make someone trip, such as rocks or tools. Regularly check to see if handrails are loose or broken. Make sure that both sides of any steps have handrails. Any raised decks and porches should have guardrails on the edges. Have any leaves, snow, or ice cleared regularly. Use sand or salt on walking paths during winter. Clean up any spills in your garage right away. This includes oil or grease spills. What can I do in the bathroom? Use night lights. Install grab bars by the toilet and in the tub and shower. Do not use towel bars as  grab bars. Use non-skid mats or decals in the tub or shower. If you need to sit down in the shower, use a plastic, non-slip stool. Keep the floor dry. Clean up any water that spills on the floor as soon as it happens. Remove soap buildup in the tub or shower regularly. Attach bath mats securely with double-sided non-slip rug tape. Do not have throw rugs and other things on the floor that can make you trip. What can I do in the bedroom? Use night lights. Make sure that you have a light by your bed that is easy to reach. Do not use any sheets or blankets that are too big for your bed. They should not hang down onto the floor. Have a firm chair that has side arms. You can use this for support while you get dressed. Do not have throw rugs and other things on the floor that can make you trip. What can I do in the kitchen? Clean up any spills right away. Avoid walking on wet floors. Keep items that you use a lot in easy-to-reach places. If you need to  reach something above you, use a strong step stool that has a grab bar. Keep electrical cords out of the way. Do not use floor polish or wax that makes floors slippery. If you must use wax, use non-skid floor wax. Do not have throw rugs and other things on the floor that can make you trip. What can I do with my stairs? Do not leave any items on the stairs. Make sure that there are handrails on both sides of the stairs and use them. Fix handrails that are broken or loose. Make sure that handrails are as long as the stairways. Check any carpeting to make sure that it is firmly attached to the stairs. Fix any carpet that is loose or worn. Avoid having throw rugs at the top or bottom of the stairs. If you do have throw rugs, attach them to the floor with carpet tape. Make sure that you have a light switch at the top of the stairs and the bottom of the stairs. If you do not have them, ask someone to add them for you. What else can I do to help prevent  falls? Wear shoes that: Do not have high heels. Have rubber bottoms. Are comfortable and fit you well. Are closed at the toe. Do not wear sandals. If you use a stepladder: Make sure that it is fully opened. Do not climb a closed stepladder. Make sure that both sides of the stepladder are locked into place. Ask someone to hold it for you, if possible. Clearly mark and make sure that you can see: Any grab bars or handrails. First and last steps. Where the edge of each step is. Use tools that help you move around (mobility aids) if they are needed. These include: Canes. Walkers. Scooters. Crutches. Turn on the lights when you go into a dark area. Replace any light bulbs as soon as they burn out. Set up your furniture so you have a clear path. Avoid moving your furniture around. If any of your floors are uneven, fix them. If there are any pets around you, be aware of where they are. Review your medicines with your doctor. Some medicines can make you feel dizzy. This can increase your chance of falling. Ask your doctor what other things that you can do to help prevent falls. This information is not intended to replace advice given to you by your health care provider. Make sure you discuss any questions you have with your health care provider. Document Released: 06/21/2009 Document Revised: 01/31/2016 Document Reviewed: 09/29/2014 Elsevier Interactive Patient Education  2017 [employer].

## 2023-02-05 NOTE — Progress Notes (Signed)
Subjective:   Syeira Stave is a 72 y.o. female who presents for Medicare Annual (Subsequent) preventive examination.  Review of Systems     Cardiac Risk Factors include: advanced age (>40men, >60 women);obesity (BMI >30kg/m2);hypertension;dyslipidemia     Objective:    Today's Vitals   02/05/23 0957  BP: 120/80  Pulse: 97  Temp: 98.5 F (36.9 C)  SpO2: 96%  Weight: 147 lb (66.7 kg)   Body mass index is 31.81 kg/m.     02/05/2023   10:06 AM 01/30/2022    9:16 AM 01/24/2021    8:58 AM 11/13/2020    1:04 PM 12/19/2019    1:43 PM  Advanced Directives  Does Patient Have a Medical Advance Directive? No Yes Yes Yes No  Type of Science writer of [employer] Power of Attorney   Does patient want to make changes to medical advance directive?    No - Patient declined   Copy of Healthcare Power of Attorney in Chart?  No - copy requested No - copy requested No - copy requested   Would patient like information on creating a medical advance directive? Yes (MAU/Ambulatory/Procedural Areas - Information given)    Yes (MAU/Ambulatory/Procedural Areas - Information given)    Current Medications (verified) Outpatient Encounter Medications as of 02/05/2023  Medication Sig   albuterol (VENTOLIN HFA) 108 (90 Base) MCG/ACT inhaler INHALE 1 TO 2 PUFFS EVERY 6 HOURS AS NEEDED FOR WHEEZING OR SHORTNESS OF BREATH.   amLODipine (NORVASC) 10 MG tablet Take 1 tablet (10 mg total) by mouth daily.   b complex vitamins capsule Take 1 capsule by mouth daily.   chlorthalidone (HYGROTON) 25 MG tablet Take 25 mg by mouth daily.   diclofenac Sodium (VOLTAREN) 1 % GEL Apply 2 g topically 4 (four) times daily as needed.   latanoprost (XALATAN) 0.005 % ophthalmic solution INSTILL 1 DROP INTO BOTH EYES AT BEDTIME   omeprazole (PRILOSEC) 20 MG capsule Take 1 capsule (20 mg total) by mouth daily.   rosuvastatin (CRESTOR) 20 MG tablet TAKE 1 TABLET  (20 MG TOTAL) BY MOUTH AT BEDTIME.   No facility-administered encounter medications on file as of 02/05/2023.    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 Recheck 2-3 years.    Past Surgical History:  Procedure Laterality Date   ABDOMINAL HYSTERECTOMY  1983   CESAREAN SECTION  1971   Family History  Problem Relation Age of Onset   Arthritis Mother    Birth defects Mother    Diabetes Mother    Depression Father    Breast cancer Sister    Cancer Sister    Heart attack Maternal Grandmother    Stroke Maternal Grandfather    Social History   Socioeconomic History   Marital status: Married    Spouse name: Not on file   Number of children: 1   Years of education: Not on file   Highest education level: Not on file  Occupational History   Occupation: Airline pilot retired  Tobacco Use   Smoking status: Former   Smokeless tobacco: Never  Substance and Sexual  Activity   Alcohol use: Yes   Drug use: Never   Sexual activity: Not on file  Other Topics Concern   Not on file  Social History Narrative   Not on file   Social Determinants of Health   Financial Resource Strain: Low Risk  (02/05/2023)   Overall Financial Resource Strain (CARDIA)    Difficulty of Paying Living Expenses: Not hard at all  Food Insecurity: No Food Insecurity (02/05/2023)   Hunger Vital Sign    Worried About Running Out of Food in the Last Year: Never true    Ran Out of Food in the Last Year: Never true  Transportation Needs: No Transportation Needs (02/05/2023)   PRAPARE - Administrator, Civil Service (Medical): No    Lack of  Transportation (Non-Medical): No  Physical Activity: Insufficiently Active (02/05/2023)   Exercise Vital Sign    Days of Exercise per Week: 1 day    Minutes of Exercise per Session: 30 min  Stress: No Stress Concern Present (02/05/2023)   [employer] of Occupational Health - Occupational Stress Questionnaire    Feeling of Stress : Not at all  Social Connections: Moderately Isolated (02/05/2023)   Social Connection and Isolation Panel [NHANES]    Frequency of Communication with Friends and Family: More than three times a week    Frequency of Social Gatherings with Friends and Family: More than three times a week    Attends Religious Services: Never    Database administrator or Organizations: No    Attends Engineer, structural: Never    Marital Status: Married    Tobacco Counseling Counseling given: Not Answered   Clinical Intake:  Pre-visit preparation completed: Yes  Pain : No/denies pain     BMI - recorded: 31.81 Nutritional Status: BMI > 30  Obese Nutritional Risks: None Diabetes: No  How often do you need to have someone help you when you read instructions, pamphlets, or other written materials from your doctor or pharmacy?: 1 - Never  Diabetic?no  Interpreter Needed?: No  Information entered by :: Lanier Ensign, LPN   Activities of Daily Living    02/05/2023   10:07 AM  In your present state of health, do you have any difficulty performing the following activities:  Hearing? 0  Vision? 0  Difficulty concentrating or making decisions? 0  Walking or climbing stairs? 0  Dressing or bathing? 0  Doing errands, shopping? 0  Preparing Food and eating ? N  Using the Toilet? N  In the past six months, have you accidently leaked urine? N  Do you have problems with loss of bowel control? N  Managing your Medications? N  Managing your Finances? N  Housekeeping or managing your Housekeeping? N    Patient Care Team: Willow Ora, MD as PCP -  General (Family Medicine)  Indicate any recent Medical Services you may have received from other than [facility] providers in the past year (date may be approximate).     Assessment:   This is a routine wellness examination for [place].  Hearing/Vision screen Hearing Screening - Comments:: Pt denies any hearing issues  Vision Screening - Comments:: Pt follows up with [facility]   Dietary issues and exercise activities discussed: Current Exercise Habits: Home exercise routine, Type of exercise: walking, Time (Minutes): 30, Frequency (Times/Week): 1, Weekly Exercise (Minutes/Week): 30   Goals Addressed             This Visit's Progress  Patient Stated       Maintain health        Depression Screen    02/05/2023   10:04 AM 12/17/2022    8:33 AM 06/04/2022    2:10 PM 01/30/2022    9:15 AM 03/07/2021    9:11 AM 01/24/2021    8:56 AM 12/19/2019    1:44 PM  PHQ 2/9 Scores  PHQ - 2 Score 0 0 0 0 0 0 0  PHQ- 9 Score 0 0         Fall Risk    02/05/2023   10:07 AM 12/17/2022    8:32 AM 06/04/2022    2:10 PM 01/30/2022    9:17 AM 07/29/2021    8:57 AM  Fall Risk   Falls in the past year? 0 0 0 0 0  Number falls in past yr: 0 0 0 0   Injury with Fall? 0 0 0 0   Risk for fall due to : Impaired vision  No Fall Risks Impaired vision   Follow up Falls prevention discussed Falls evaluation completed Falls evaluation completed Falls prevention discussed     FALL RISK PREVENTION PERTAINING TO THE HOME:  Any stairs in or around the home? Yes  If so, are there any without handrails? No  Home free of loose throw rugs in walkways, pet beds, electrical cords, etc? Yes  Adequate lighting in your home to reduce risk of falls? Yes   ASSISTIVE DEVICES UTILIZED TO PREVENT FALLS:  Life alert? Apple phone  Use of a cane, walker or w/c? No  Grab bars in the bathroom? No  Shower chair or bench in shower? No  Elevated toilet seat or a handicapped toilet? No   TIMED UP AND GO:  Was the test  performed? Yes .  Length of time to ambulate 10 feet: 10 sec.   Gait steady and fast without use of assistive device  Cognitive Function:        02/05/2023   10:08 AM 01/30/2022    9:18 AM 01/24/2021    9:01 AM 12/19/2019    1:43 PM  6CIT Screen  What Year? 0 points 0 points 0 points 0 points  What month? 0 points 0 points 0 points 0 points  What time? 0 points 0 points  0 points  Count back from 20 0 points 0 points 0 points 0 points  Months in reverse 0 points 0 points 0 points 0 points  Repeat phrase 4 points 0 points 10 points 0 points  Total Score 4 points 0 points  0 points    Immunizations Immunization History  Administered Date(s) Administered   Fluad Quad(high Dose 65+) 05/22/2020, 05/20/2021, 06/04/2022   Influenza, High Dose Seasonal PF 06/09/2018   Influenza,inj,Quad PF,6+ Mos 06/08/2019   PFIZER(Purple Top)SARS-COV-2 Vaccination 12/15/2019, 01/09/2020, 07/29/2020, 12/21/2020   Pfizer Covid-19 Vaccine Bivalent Booster 61yrs & up 05/20/2021   Pneumococcal Conjugate-13 09/06/2020   Pneumococcal Polysaccharide-23 08/08/2019   Tdap 07/08/2019   Zoster Recombinat (Shingrix) 07/08/2019, 05/20/2021    TDAP status: Up to date  Flu Vaccine status: Up to date  Pneumococcal vaccine status: Up to date  Covid-19 vaccine status: Completed vaccines  Qualifies for Shingles Vaccine? Yes   Zostavax completed Yes   Shingrix Completed?: Yes  Screening Tests Health Maintenance  Topic Date Due   COVID-19 Vaccine (6 - 2023-24 season) 05/09/2022   DEXA SCAN  12/26/2022   Fecal DNA (Cologuard)  01/07/2023   INFLUENZA VACCINE  04/09/2023  MAMMOGRAM  09/27/2023   Medicare Annual Wellness (AWV)  02/05/2024   DTaP/Tdap/Td (2 - Td or Tdap) 07/07/2029   Pneumonia Vaccine 17+ Years old  Completed   Hepatitis C Screening  Completed   Zoster Vaccines- Shingrix  Completed   HPV VACCINES  Aged Out   Colonoscopy  Discontinued    Health Maintenance  Health Maintenance Due   Topic Date Due   COVID-19 Vaccine (6 - 2023-24 season) 05/09/2022   DEXA SCAN  12/26/2022   Fecal DNA (Cologuard)  01/07/2023    Colorectal cancer screening: Referral to GI placed 02/05/23. Pt aware the office will call re: appt.  Mammogram status: Completed 09/26/22. Repeat every year  Bone Density status: Ordered 02/05/23. Pt provided with contact info and advised to call to schedule appt.   Additional Screening:  Hepatitis C Screening: Completed 09/30/19  Vision Screening: Recommended annual ophthalmology exams for early detection of glaucoma and other disorders of the eye. Is the patient up to date with their annual eye exam?  Yes  Who is the provider or what is the name of the office in which the patient attends annual eye exams? [facility]  If pt is not established with a provider, would they like to be referred to a provider to establish care? No .   Dental Screening: Recommended annual dental exams for proper oral hygiene  Community Resource Referral / Chronic Care Management: CRR required this visit?  No   CCM required this visit?  No      Plan:     I have personally reviewed and noted the following in the patient's chart:   Medical and social history Use of alcohol, tobacco or illicit drugs  Current medications and supplements including opioid prescriptions. Patient is not currently taking opioid prescriptions. Functional ability and status Nutritional status Physical activity Advanced directives List of other physicians Hospitalizations, surgeries, and ER visits in previous 12 months Vitals Screenings to include cognitive, depression, and falls Referrals and appointments  In addition, I have reviewed and discussed with patient certain preventive protocols, quality metrics, and best practice recommendations. A written personalized care plan for preventive services as well as general preventive health recommendations were provided to patient.     Marzella Schlein, LPN   [id number]/[id number]   Nurse Notes: none

## 2023-02-11 ENCOUNTER — Inpatient Hospital Stay (HOSPITAL_BASED_OUTPATIENT_CLINIC_OR_DEPARTMENT_OTHER): Admission: RE | Admit: 2023-02-11 | Payer: Medicare HMO | Source: Ambulatory Visit

## 2023-02-12 DIAGNOSIS — R6889 Other general symptoms and signs: Secondary | ICD-10-CM | POA: Diagnosis not present

## 2023-02-16 ENCOUNTER — Ambulatory Visit: Payer: Medicare HMO | Admitting: Family Medicine

## 2023-02-18 ENCOUNTER — Ambulatory Visit (HOSPITAL_BASED_OUTPATIENT_CLINIC_OR_DEPARTMENT_OTHER)
Admission: RE | Admit: 2023-02-18 | Discharge: 2023-02-18 | Disposition: A | Discharge status: Home / Self Care | Payer: Medicare HMO | Source: Ambulatory Visit | Attending: Family Medicine | Admitting: Family Medicine

## 2023-02-18 DIAGNOSIS — R6889 Other general symptoms and signs: Secondary | ICD-10-CM | POA: Diagnosis not present

## 2023-02-18 DIAGNOSIS — Z780 Asymptomatic menopausal state: Secondary | ICD-10-CM | POA: Diagnosis not present

## 2023-02-18 DIAGNOSIS — E2839 Other primary ovarian failure: Secondary | ICD-10-CM | POA: Diagnosis not present

## 2023-02-18 DIAGNOSIS — M85852 Other specified disorders of bone density and structure, left thigh: Secondary | ICD-10-CM | POA: Diagnosis not present

## 2023-03-26 NOTE — Progress Notes (Signed)
03/27/2023 Kathleen Morse [id number] 1951-04-17  Referring provider: Willow Ora, MD Primary GI doctor: Dr. Myrtie Neither  ASSESSMENT AND PLAN:   Gastroesophageal reflux disease worsening last 8-9 month with burping No dysphagia, weight loss or melena She does drink every weekend with heavier drinking the past, occ aleve, some mild epigastric tenderness on exam.  Lifestyle changes discussed, avoid NSAIDS, ETOH Weight loss discussed with the patient Start on pantoprazole 40 mg emphasized PPI 30 min to an hour before food Will schedule for EGD at [facility] with Dr. Myrtie Neither  I discussed risks of EGD with patient today, including risk of sedation, bleeding or perforation.  Patient provides understanding and gave verbal consent to proceed. Will get AB [place] with tenderness on exam, family history of gallbladder issues, and drinking history Consider GES Belching without alarm features such as weight loss, abdominal pain, dysphagia, pyrosis / regurgitation.  This could be caused by GERD, functional dyspepsia, supragastric belching, SIBO -Recommended efforts to decrease air swallowing in AVS -Consider treating/testing for SIBO  Chronic idiopathic constipation/Screening for colon cancer Cologuard negative 01/2020, due for repeat screening - Increase fiber/ water intake, decrease caffeine, increase activity level. -Will add on Miralax daily and Benefiber -Will schedule colonoscopy for screening at [facility] with Dr. Myrtie Neither -We have discussed the risks of bleeding, infection, perforation, medication reactions, and remote risk of death associated with colonoscopy. All questions were answered and the patient acknowledges these risk and wishes to proceed. - check TSH  Habitual alcohol use Suggested stopping due to GERD/beltching Check LFTs, check AB [place]    Patient Care Team: Willow Ora, MD as PCP - General (Family Medicine)  HISTORY OF PRESENT ILLNESS: 72 y.o. female with a past medical  history of HTN, asthma, and others listed below presents for evaluation of GERD, beltching and constipation.   Patient denies family history of colon cancer or other gastrointestinal malignancies. Colonoscopy 2013 in [place] per PCP notes and states they moved here from [place] in 2017 so sometime before that.  01/07/2020 Cologuard negative 12/17/2022 CBC without anemia, normal thyroid, unremarkable kidney and liver.  Husband is here with her and provides some of the history.  Patient reports GERD symptoms for at least 10 years, worse in the last 8-9 months. Denies any associated AB pain, nausea, vomiting, dysphagia, melena. Worse with any food, worse in the day. Helps with sitting up, worse with lying down, better with baking soda with cold water. Helps her burp and feels this helps the indigestion.   She  reports AB bloating, with any food, and gets full very quickly.   No unintentional weight loss. She has hot flashes day and night from menopause. She has constipation long term, she has BM daily, tries to increase veggies and fruit, will take laxative 2 x a week.  She feels she never completely empties, no blood in the stool.  No new supplements or medications in the last year. She sucks on hard candy all the time, no carb soda, no gum, occ straw.    She is on aleve as needed for pain, 2-3 x a week.  She reports ETOH use, mimosas with champagne on weekends only, Wine would cause issues so stopped.   She reports family history of gallbladder issues with her mother.   She denies blood thinner use.  She denies tobacco use, quit over 20 years ago.  She denies drug use.  Wt Readings from Last 3 Encounters:  03/27/23 148 lb (67.1 kg)  02/05/23  147 lb (66.7 kg)  12/17/22 144 lb 6 oz (65.5 kg)     She  reports that she has quit smoking. She has never used smokeless tobacco. She reports current alcohol use. She reports that she does not use drugs.  RELEVANT LABS AND IMAGING: CBC    Component Value  Date/Time   WBC 8.9 12/17/2022 0912   RBC 4.32 12/17/2022 0912   HGB 13.1 12/17/2022 0912   HCT 38.5 12/17/2022 0912   PLT 368.0 12/17/2022 0912   MCV 89.2 12/17/2022 0912   MCH 29.9 04/09/2020 1006   MCHC 34.0 12/17/2022 0912   RDW 13.9 12/17/2022 0912   LYMPHSABS 2.7 12/17/2022 0912   MONOABS 0.7 12/17/2022 0912   EOSABS 0.2 12/17/2022 0912   BASOSABS 0.1 12/17/2022 0912   Recent Labs    06/04/22 1406 12/17/22 0912  HGB 13.2 13.1    CMP     Component Value Date/Time   NA 141 12/24/2022 1040   K 3.8 12/24/2022 1040   CL 102 12/24/2022 1040   CO2 28 12/24/2022 1040   GLUCOSE 112 (H) 12/24/2022 1040   BUN 10 12/24/2022 1040   CREATININE 0.82 12/24/2022 1040   CREATININE 0.88 09/30/2019 1552   CALCIUM 9.7 12/24/2022 1040   PROT 7.1 12/24/2022 1040   ALBUMIN 4.3 12/24/2022 1040   AST 26 12/24/2022 1040   ALT 26 12/24/2022 1040   ALKPHOS 62 12/24/2022 1040   BILITOT 0.5 12/24/2022 1040      Latest Ref Rng & Units 12/24/2022   10:40 AM 06/04/2022    2:06 PM 10/30/2020    9:28 AM  Hepatic Function  Total Protein 6.0 - 8.3 g/dL 7.1  7.8  7.7   Albumin 3.5 - 5.2 g/dL 4.3  4.4  4.2   AST 0 - 37 U/L 26  25  30    ALT 0 - 35 U/L 26  26  24    Alk Phosphatase 39 - 117 U/L 62  65  58   Total Bilirubin 0.2 - 1.2 mg/dL 0.5  0.4  0.5       Current Medications:    Current Outpatient Medications (Cardiovascular):    amLODipine (NORVASC) 10 MG tablet, Take 1 tablet (10 mg total) by mouth daily.   chlorthalidone (HYGROTON) 25 MG tablet, Take 25 mg by mouth daily.   rosuvastatin (CRESTOR) 20 MG tablet, TAKE 1 TABLET (20 MG TOTAL) BY MOUTH AT BEDTIME.  Current Outpatient Medications (Respiratory):    albuterol (VENTOLIN HFA) 108 (90 Base) MCG/ACT inhaler, INHALE 1 TO 2 PUFFS EVERY 6 HOURS AS NEEDED FOR WHEEZING OR SHORTNESS OF BREATH.    Current Outpatient Medications (Other):    b complex vitamins capsule, Take 1 capsule by mouth daily.   diclofenac Sodium (VOLTAREN) 1 %  GEL, Apply 2 g topically 4 (four) times daily as needed.   latanoprost (XALATAN) 0.005 % ophthalmic solution, INSTILL 1 DROP INTO BOTH EYES AT BEDTIME   pantoprazole (PROTONIX) 40 MG tablet, Take 1 tablet (40 mg total) by mouth daily.   senna (SENOKOT) 8.6 MG tablet, Take 1 tablet by mouth as needed for constipation.  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 Recheck 2-3 years.    Allergies:  Allergies  Allergen Reactions   Pineapple Hives   Penicillins Rash     Surgical History:  She  has a past surgical history that includes Abdominal hysterectomy (7846) and Cesarean section (1971). Family History:  Her family history includes Arthritis in her mother; Birth defects in her mother; Breast cancer in her sister; Cancer in her sister; Depression in her father; Diabetes in her mother; Heart attack in her maternal grandmother; Stroke in her maternal grandfather.  REVIEW OF SYSTEMS  : All other systems reviewed and negative except where noted in the History of Present Illness.  PHYSICAL EXAM: BP 124/78   Pulse 67   Ht 4\' 10"  (1.473 m)   Wt 148 lb (67.1 kg)   BMI 30.93 kg/m  General Appearance: Well nourished, in no apparent distress. Head:   Normocephalic and atraumatic. Eyes:  sclerae anicteric,conjunctive pink  Respiratory: Respiratory effort normal, BS equal bilaterally without rales, rhonchi, wheezing. Cardio: RRR with no MRGs. Peripheral pulses intact.  Abdomen: Soft,  Obese ,active bowel sounds. mild tenderness in the epigastrium. Without guarding and Without rebound. No masses. Rectal: Not evaluated Musculoskeletal: Full ROM, Normal gait. With edema. Skin:   Dry and intact without significant lesions or rashes Neuro: Alert and  oriented x4;  No focal deficits. Psych:  Cooperative. Normal mood and affect.    Doree Albee, PA-C 9:55 AM

## 2023-03-27 ENCOUNTER — Encounter: Payer: Self-pay | Admitting: Physician Assistant

## 2023-03-27 ENCOUNTER — Ambulatory Visit: Payer: Medicare HMO | Admitting: Physician Assistant

## 2023-03-27 VITALS — BP 124/78 | HR 67 | Ht <= 58 in | Wt 148.0 lb

## 2023-03-27 DIAGNOSIS — Z1211 Encounter for screening for malignant neoplasm of colon: Secondary | ICD-10-CM

## 2023-03-27 DIAGNOSIS — K5904 Chronic idiopathic constipation: Secondary | ICD-10-CM | POA: Diagnosis not present

## 2023-03-27 DIAGNOSIS — R142 Eructation: Secondary | ICD-10-CM

## 2023-03-27 DIAGNOSIS — F1090 Alcohol use, unspecified, uncomplicated: Secondary | ICD-10-CM

## 2023-03-27 DIAGNOSIS — K219 Gastro-esophageal reflux disease without esophagitis: Secondary | ICD-10-CM | POA: Diagnosis not present

## 2023-03-27 MED ORDER — NA SULFATE-K SULFATE-MG SULF 17.5-3.13-1.6 GM/177ML PO SOLN: 1.0000 | Freq: Once | ORAL | 0 refills | Status: AC

## 2023-03-27 MED ORDER — PANTOPRAZOLE SODIUM 40 MG PO TBEC
40.0000 mg | DELAYED_RELEASE_TABLET | Freq: Every day | ORAL | 3 refills | Status: DC
Start: 2023-03-27 — End: 2023-08-24

## 2023-03-27 NOTE — Patient Instructions (Signed)
We have sent the following medications to your pharmacy for you to pick up at your convenience: Protonix   You have been scheduled for a colonoscopy. Please follow written instructions given to you at your visit today.   Please pick up your prep supplies at the pharmacy within the next 1-3 days.  If you use inhalers (even only as needed), please bring them with you on the day of your procedure.  DO NOT TAKE 7 DAYS PRIOR TO TEST- Trulicity (dulaglutide) Ozempic, Wegovy (semaglutide) Mounjaro (tirzepatide) Bydureon Bcise (exanatide extended release)  DO NOT TAKE 1 DAY PRIOR TO YOUR TEST Rybelsus (semaglutide) Adlyxin (lixisenatide) Victoza (liraglutide) Byetta (exanatide) ___________________________________________________________________________ .Marland KitchenYou have been scheduled for an abdominal ultrasound [facility] (1st [facility] of hospital) on 04/07/2023 at 8:00am. Please arrive 30 minutes prior to your appointment for registration. Make certain not to have anything to eat or drink 6 hours prior to your appointment. Should you need to reschedule your appointment, please contact radiology at [phone]. This test typically takes about 30 minutes to perform.   Please take your proton pump inhibitor medication, pantoprazole 40 mg once daily  Please take this medication 30 minutes to 1 hour before meals- this makes it more effective.  Avoid spicy and acidic foods Avoid fatty foods Limit your intake of coffee, tea, alcohol, and carbonated drinks Work to maintain a healthy weight Keep the head of the bed elevated at least 3 inches with blocks or a wedge pillow if you are having any nighttime symptoms Stay upright for 2 hours after eating Avoid meals and snacks three to four hours before bedtime  Miralax is an osmotic laxative.  It only brings more water into the stool.  This is safe to take daily.  Can take up to 17 gram of miralax twice a day.  Mix with juice or coffee.  Start  1 capful at night for 3-4 days and reassess your response in 3-4 days.  You can increase and decrease the dose based on your response.  Remember, it can take up to 3-4 days to take effect OR for the effects to wear off.   I often pair this with benefiber in the morning to help assure the stool is not too loose.   Toileting tips to help with your constipation - Drink at least 64-80 ounces of water/liquid per day. - Establish a time to try to move your bowels every day.  For many people, this is after a cup of coffee or after a meal such as breakfast. - Sit all of the way back on the toilet keeping your back fairly straight and while sitting up, try to rest the tops of your forearms on your upper thighs.   - Raising your feet with a step stool/squatty potty can be helpful to improve the angle that allows your stool to pass through the rectum. - Relax the rectum feeling it bulge toward the toilet water.  If you feel your rectum raising toward your body, you are contracting rather than relaxing. - Breathe in and slowly exhale. "Belly breath" by expanding your belly towards your belly button. Keep belly expanded as you gently direct pressure down and back to the anus.  A low pitched GRRR sound can assist with increasing intra-abdominal pressure.  (Can also trying to blow on a pinwheel and make it move, this helps with the same belly breathing) - Repeat 3-4 times. If unsuccessful, contract the pelvic floor to restore normal tone and get off the  toilet.  Avoid excessive straining. - To reduce excessive wiping by teaching your anus to normally contract, place hands on outer aspect of knees and resist knee movement outward.  Hold 5-10 second then place hands just inside of knees and resist inward movement of knees.  Hold 5 seconds.  Repeat a few times each way.  Go to the ER if unable to pass gas, severe AB pain, unable to hold down food, any shortness of breath of chest pain.   Remember belching is  caused by excessive air swallowing.   Please stop: Eating or drinking too fast  Poorly fitting dentures; not chewing food completely  Carbonated beverages  Chewing gum or sucking on hard candies  Excessive swallowing due to nervous tension or postnasal drip  Forced belching to relieve abdominal discomfort To prevent excessive belching, avoid:  Carbonated beverages  Chewing gum  Hard candies   Simethicone/GasX may be helpful  Can try Florastor probiotic twice a day  Your provider has requested that you go to the basement level for lab work before leaving today. Press "B" on the elevator. The lab is located at the first door on the left as you exit the elevator.  _______________________________________________________  If your blood pressure at your visit was 140/90 or greater, please contact your primary care physician to follow up on this.  _______________________________________________________  If you are age 71 or older, your body mass index should be between 23-30. Your There is no height or weight on file to calculate BMI. If this is out of the aforementioned range listed, please consider follow up with your Primary Care Provider.  If you are age 71 or younger, your body mass index should be between 19-25. Your There is no height or weight on file to calculate BMI. If this is out of the aformentioned range listed, please consider follow up with your Primary Care Provider.   ________________________________________________________  The [place] [facility] providers would like to encourage you to use [facility] to communicate with providers for non-urgent requests or questions.  Due to long hold times on the telephone, sending your provider a message by [facility] may be a faster and more efficient way to get a response.  Please allow 48 business hours for a response.  Please remember that this is for non-urgent requests.  _______________________________________________________ It was a pleasure  to see you today!  Thank you for trusting me with your gastrointestinal care!

## 2023-03-27 NOTE — Progress Notes (Signed)
____________________________________________________________  Attending physician addendum:  Thank you for sending this case to me. I have reviewed the entire note and agree with the plan.   Henry Danis, MD  ____________________________________________________________  

## 2023-04-07 ENCOUNTER — Ambulatory Visit (HOSPITAL_COMMUNITY)
Admission: RE | Admit: 2023-04-07 | Discharge: 2023-04-07 | Disposition: A | Discharge status: Home / Self Care | Payer: Medicare HMO | Source: Ambulatory Visit | Attending: Physician Assistant | Admitting: Physician Assistant

## 2023-04-07 DIAGNOSIS — K219 Gastro-esophageal reflux disease without esophagitis: Secondary | ICD-10-CM | POA: Insufficient documentation

## 2023-04-07 DIAGNOSIS — R109 Unspecified abdominal pain: Secondary | ICD-10-CM | POA: Diagnosis not present

## 2023-04-07 DIAGNOSIS — R6889 Other general symptoms and signs: Secondary | ICD-10-CM | POA: Diagnosis not present

## 2023-05-13 DIAGNOSIS — R6889 Other general symptoms and signs: Secondary | ICD-10-CM | POA: Diagnosis not present

## 2023-05-13 DIAGNOSIS — H401134 Primary open-angle glaucoma, bilateral, indeterminate stage: Secondary | ICD-10-CM | POA: Diagnosis not present

## 2023-05-21 ENCOUNTER — Encounter: Payer: Medicare HMO | Admitting: Gastroenterology

## 2023-05-21 ENCOUNTER — Telehealth: Payer: Self-pay | Admitting: Gastroenterology

## 2023-05-21 DIAGNOSIS — R6889 Other general symptoms and signs: Secondary | ICD-10-CM | POA: Diagnosis not present

## 2023-05-21 NOTE — Telephone Encounter (Signed)
Good Afternoon Dr. Myrtie Neither,  I called this patient at 12:45pm today to see if she was coming for her appointment.  She stated that she did not show having an appointment.  She put her husband on the phone and he stated the only appointment he showed was 9-24 I told him we did not have her on any of our schedules for procedure or office visit on that day.  Patient and Husband were confused. Patient did not prep. Patients husband stated he would call back to reschedule.    I will NO SHOW patient.  [employer]

## 2023-05-21 NOTE — Telephone Encounter (Signed)
Thank you for the note.   For documentation purposes  --   procedure date and all instructions were given to this patient at the time of her office visit on 03/27/2023.  We will await a callback patient with how she would like to proceed.  H Danis

## 2023-05-25 ENCOUNTER — Ambulatory Visit (INDEPENDENT_AMBULATORY_CARE_PROVIDER_SITE_OTHER): Payer: Medicare HMO | Admitting: Family Medicine

## 2023-05-25 ENCOUNTER — Encounter: Payer: Self-pay | Admitting: Family Medicine

## 2023-05-25 ENCOUNTER — Telehealth: Payer: Self-pay | Admitting: Physician Assistant

## 2023-05-25 VITALS — BP 110/88 | HR 102 | Temp 98.3°F | Ht <= 58 in | Wt 151.4 lb

## 2023-05-25 DIAGNOSIS — G8929 Other chronic pain: Secondary | ICD-10-CM | POA: Diagnosis not present

## 2023-05-25 DIAGNOSIS — G301 Alzheimer's disease with late onset: Secondary | ICD-10-CM

## 2023-05-25 DIAGNOSIS — F02A0 Dementia in other diseases classified elsewhere, mild, without behavioral disturbance, psychotic disturbance, mood disturbance, and anxiety: Secondary | ICD-10-CM

## 2023-05-25 DIAGNOSIS — Z23 Encounter for immunization: Secondary | ICD-10-CM

## 2023-05-25 DIAGNOSIS — L2084 Intrinsic (allergic) eczema: Secondary | ICD-10-CM | POA: Diagnosis not present

## 2023-05-25 DIAGNOSIS — I10 Essential (primary) hypertension: Secondary | ICD-10-CM | POA: Diagnosis not present

## 2023-05-25 DIAGNOSIS — T783XXA Angioneurotic edema, initial encounter: Secondary | ICD-10-CM

## 2023-05-25 DIAGNOSIS — M545 Low back pain, unspecified: Secondary | ICD-10-CM | POA: Diagnosis not present

## 2023-05-25 MED ORDER — TRIAMCINOLONE ACETONIDE 0.1 % EX CREA: 1.0000 | TOPICAL_CREAM | Freq: Two times a day (BID) | CUTANEOUS | 0 refills | Status: DC

## 2023-05-25 MED ORDER — DONEPEZIL HCL 10 MG PO TABS: 10.0000 mg | ORAL_TABLET | Freq: Every day | ORAL | 3 refills | Status: DC

## 2023-05-25 NOTE — Telephone Encounter (Signed)
Inbound call from husband requesting a call to rescheduling 9/12 endoscopy and colonoscopy. Please advise on scheduling. Please advise, thank you.

## 2023-05-25 NOTE — Telephone Encounter (Signed)
Please reschedule pt for EndoColon and let me know so I can then send prep instructions.

## 2023-05-25 NOTE — Progress Notes (Signed)
Subjective  CC:  Chief Complaint  Patient presents with   Back Pain    HPI: Kathleen Morse is a 72 y.o. female who presents to the office today to address the problems listed above in the chief complaint. 72 year old female here with her husband with multiple complaints Allergies: Husband reports he thinks he is allergic to some foods.  Reports rashes intermittently.  Does have known history of eczema.  Patient reports dry itchy rash on elbows and ankles intermittently.  But husband reports also that she has had 2 episodes of a swollen tongue, 1 after up a payday candy bar.  He thinks it could be related to peanuts.  No known history of peanut allergy.  She took a Benadryl and symptoms resolved.  No shortness of breath or lip swelling.  No history of anaphylaxis.  However she did have an ER visit in 2022 due to angioedema of the lips.  Unclear trigger Memory concerns: Addressed in the past.  Worsening.  Strong family history of Alzheimer's disease, both parents and now sister.  Patient having trouble with remembering recent conversations.  Gets confused on days of the week etc.  Long-term memory seems to be stable.  No loss of function.  She lives with her husband who is with her all the time.  Has history of habitual alcohol use but she has cut this down to almost nothing now.  Her sister is now in assisted living due to dementia.  This stresses her greatly.  Both parents died from dementia She denies mood problems. Her blood pressure is controlled on amlodipine and HCTZ. Abdominal pain: She saw GI recently.  Is to be rescheduled for endoscopy and colonoscopy. Chronic back pain: Has had multiple workups in different states.  Chronic DJD.  Uses Tylenol.  No new symptoms.  Declines further evaluation or referrals.  Takes Tylenol or naproxen which is helpful.  Assessment  1. Chronic midline low back pain without sciatica   2. Need for influenza vaccination   3. Angioedema of tongue   4.  Mild late onset Alzheimer's dementia without behavioral disturbance, psychotic disturbance, mood disturbance, or anxiety (HCC)   5. Essential hypertension   6. Intrinsic eczema      Plan  Chronic back pain: Further education given. Flu shot updated today Angioedema of tongue: Recurrent.  Referral for allergy initiated.  Benadryl as needed.  Consider adding Zyrtec daily. Recent for dementia.  Failed mini cog today.  Start Aricept after education given.  Patient declines referrals.  Stop all alcohol use. Hypertension is controlled Triamcinolone as needed for eczema  I spent a total of 41 minutes for this patient encounter. Time spent included preparation, face-to-face counseling with the patient and coordination of care, review of chart and records, and documentation of the encounter.  Follow up: 3 months for recheck 08/24/2023  Orders Placed This Encounter  Procedures   Flu Vaccine Trivalent High Dose (Fluad)   Ambulatory referral to Allergy   Meds ordered this encounter  Medications   triamcinolone cream (KENALOG) 0.1 %    Sig: Apply 1 Application topically 2 (two) times daily. For 2 weeks, then as needed    Dispense:  45 g    Refill:  0   donepezil (ARICEPT) 10 MG tablet    Sig: Take 1 tablet (10 mg total) by mouth at bedtime.    Dispense:  90 tablet    Refill:  3      I reviewed the patients updated PMH,  FH, and SocHx.    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   amLODipine (NORVASC) 10 MG tablet Take 1 tablet (10 mg total) by mouth daily.   b complex vitamins capsule Take 1 capsule by mouth daily.   chlorthalidone (HYGROTON) 25 MG tablet Take 25 mg by mouth daily.   diclofenac Sodium (VOLTAREN) 1 % GEL Apply 2 g topically 4 (four) times daily as needed.   donepezil (ARICEPT) 10 MG tablet Take 1 tablet (10 mg total) by mouth at bedtime.   latanoprost (XALATAN) 0.005 % ophthalmic solution INSTILL 1 DROP INTO BOTH EYES AT BEDTIME   pantoprazole (PROTONIX) 40 MG tablet Take 1 tablet (40 mg total) by mouth daily.   rosuvastatin (CRESTOR) 20 MG tablet TAKE 1 TABLET (20 MG TOTAL) BY MOUTH AT BEDTIME.   senna (SENOKOT) 8.6 MG tablet Take 1 tablet by mouth as needed for constipation.   triamcinolone cream (KENALOG) 0.1 % Apply 1 Application topically 2 (two) times daily. For 2 weeks, then as needed    Allergies: Patient is allergic to pineapple and penicillins. Family History: Patient family history includes Arthritis in her mother; Birth defects in her mother; Breast cancer in her sister; Cancer in her sister; Depression in her father; Diabetes in her mother; Heart attack in her maternal grandmother; Stroke in her maternal grandfather. Social History:  Patient  reports that she has quit smoking. She has never used  smokeless tobacco. She reports current alcohol use. She reports that she does not use drugs.  Review of Systems: Constitutional: Negative for fever malaise or anorexia Cardiovascular: negative for chest pain Respiratory: negative for SOB or persistent cough Gastrointestinal: negative for abdominal pain  Objective  Vitals: BP 110/88   Pulse (!) 102   Temp 98.3 F (36.8 C)   Ht 4\' 10"  (1.473 m)   Wt 151 lb 6.4 oz (68.7 kg)   SpO2 97%   BMI 31.64 kg/m  General: no acute distress , A&Ox2 HEENT: PEERL, conjunctiva normal, neck is supple Cardiovascular:  RRR without murmur or gallop.  Respiratory:  Good breath sounds bilaterally, CTAB with normal respiratory effort Skin:  Warm, small eczematous patch on right forearm  Mini-cog: 1/3 word recall with prompting, abnormal clock.  Numbers are  correct and in order, arms are abnormal.  Lab Results  Component Value Date   TSH 2.99 12/24/2022   Lab Results  Component Value Date   NA 141 12/24/2022   CL 102 12/24/2022   K 3.8 12/24/2022   CO2 28 12/24/2022   BUN 10 12/24/2022   CREATININE 0.82 12/24/2022   GFR 72.04 12/24/2022   CALCIUM 9.7 12/24/2022   ALBUMIN 4.3 12/24/2022   GLUCOSE 112 (H) 12/24/2022   Lab Results  Component Value Date   WBC 8.9 12/17/2022   HGB 13.1 12/17/2022   HCT 38.5 12/17/2022   MCV 89.2 12/17/2022   PLT 368.0 12/17/2022    Commons side effects, risks, benefits, and alternatives for medications and treatment plan prescribed today were discussed, and the patient expressed understanding of the given instructions. Patient is instructed to call or message via MyChart if he/she has any questions or concerns regarding our treatment plan. No barriers to understanding were identified. We discussed Red Flag symptoms and signs in detail. Patient expressed understanding regarding what to do in case of urgent or emergency type symptoms.  Medication list was reconciled, printed and provided to the patient in AVS.  Patient instructions and summary information was reviewed with the patient as documented in the AVS. This note was prepared with assistance of Dragon voice recognition software. Occasional wrong-word or sound-a-like substitutions may have occurred due to the inherent limitations of voice recognition software

## 2023-05-26 NOTE — Telephone Encounter (Signed)
New prep instructions mailed to pt

## 2023-05-26 NOTE — Telephone Encounter (Signed)
Patient has been rescheduled to 10/3. Please advise, thank you.

## 2023-06-03 ENCOUNTER — Other Ambulatory Visit: Payer: Self-pay

## 2023-06-03 ENCOUNTER — Telehealth: Payer: Self-pay | Admitting: Family Medicine

## 2023-06-03 MED ORDER — AMLODIPINE BESYLATE 10 MG PO TABS: 10.0000 mg | ORAL_TABLET | Freq: Every day | ORAL | 3 refills | Status: DC

## 2023-06-03 NOTE — Telephone Encounter (Signed)
Prescription Request  06/03/2023  LOV: 05/25/2023  What is the name of the medication or equipment? amLODipine (NORVASC) 10 MG tablet   Have you contacted your pharmacy to request a refill? Yes   Which pharmacy would you like this sent to?   [id number] [place] [place] - [address] [place] [address] Phone: [phone] Fax: [phone]    Patient notified that their request is being sent to the clinical staff for review and that they should receive a response within 2 business days.   Please advise at Mobile [phone] (mobile)

## 2023-06-08 ENCOUNTER — Telehealth: Payer: Self-pay | Admitting: Gastroenterology

## 2023-06-08 NOTE — Telephone Encounter (Signed)
Inbound call from patient's husband stating he spoke with his insurance and they advised there will be no co pay on patient's procedure. Husband requesting a call back to confirm. Please advise, thank you.

## 2023-06-10 ENCOUNTER — Other Ambulatory Visit: Payer: Self-pay

## 2023-06-10 ENCOUNTER — Telehealth: Payer: Self-pay | Admitting: Physician Assistant

## 2023-06-10 MED ORDER — NA SULFATE-K SULFATE-MG SULF 17.5-3.13-1.6 GM/177ML PO SOLN: ORAL | 0 refills | Status: DC

## 2023-06-10 NOTE — Telephone Encounter (Signed)
Prescription has been sent to her pharmacy as requested.

## 2023-06-10 NOTE — Telephone Encounter (Signed)
PT is calling to have [facility] [address]. She is to start prep tonight at 6pm. Please advise.

## 2023-06-11 ENCOUNTER — Encounter: Payer: Self-pay | Admitting: Gastroenterology

## 2023-06-11 ENCOUNTER — Ambulatory Visit: Payer: Medicare HMO | Admitting: Gastroenterology

## 2023-06-11 VITALS — BP 110/66 | HR 96 | Temp 98.8°F | Resp 14

## 2023-06-11 DIAGNOSIS — R142 Eructation: Secondary | ICD-10-CM | POA: Diagnosis not present

## 2023-06-11 DIAGNOSIS — K219 Gastro-esophageal reflux disease without esophagitis: Secondary | ICD-10-CM

## 2023-06-11 DIAGNOSIS — D125 Benign neoplasm of sigmoid colon: Secondary | ICD-10-CM

## 2023-06-11 DIAGNOSIS — I10 Essential (primary) hypertension: Secondary | ICD-10-CM | POA: Diagnosis not present

## 2023-06-11 DIAGNOSIS — D123 Benign neoplasm of transverse colon: Secondary | ICD-10-CM | POA: Diagnosis not present

## 2023-06-11 DIAGNOSIS — K253 Acute gastric ulcer without hemorrhage or perforation: Secondary | ICD-10-CM | POA: Diagnosis not present

## 2023-06-11 DIAGNOSIS — Z1211 Encounter for screening for malignant neoplasm of colon: Secondary | ICD-10-CM

## 2023-06-11 DIAGNOSIS — E669 Obesity, unspecified: Secondary | ICD-10-CM | POA: Diagnosis not present

## 2023-06-11 DIAGNOSIS — K5900 Constipation, unspecified: Secondary | ICD-10-CM | POA: Diagnosis not present

## 2023-06-11 DIAGNOSIS — R6889 Other general symptoms and signs: Secondary | ICD-10-CM | POA: Diagnosis not present

## 2023-06-11 DIAGNOSIS — K635 Polyp of colon: Secondary | ICD-10-CM | POA: Diagnosis not present

## 2023-06-11 DIAGNOSIS — K319 Disease of stomach and duodenum, unspecified: Secondary | ICD-10-CM | POA: Diagnosis not present

## 2023-06-11 MED ORDER — SODIUM CHLORIDE 0.9 % IV SOLN
500.0000 mL | INTRAVENOUS | Status: DC
Start: 2023-06-11 — End: 2023-06-11

## 2023-06-11 NOTE — Op Note (Signed)
[facility] Patient Name: Kathleen Morse Procedure Date: 06/11/2023 2:15 PM MRN: [id number] Endoscopist: Sherilyn Cooter L. Myrtie Neither , MD, [id number] Age: 72 Referring MD:  Date of Birth: 06/25/1951 Gender: Female Account #: [id number] Procedure:                Upper GI endoscopy Indications:              Heartburn, Eructation (main symptom)                           clinical details in 03/27/23 office consult note Medicines:                Monitored Anesthesia Care Procedure:                Pre-Anesthesia Assessment:                           - Prior to the procedure, a History and Physical                            was performed, and patient medications and                            allergies were reviewed. The patient's tolerance of                            previous anesthesia was also reviewed. The risks                            and benefits of the procedure and the sedation                            options and risks were discussed with the patient.                            All questions were answered, and informed consent                            was obtained. Prior Anticoagulants: The patient has                            taken no anticoagulant or antiplatelet agents. ASA                            Grade Assessment: II - A patient with mild systemic                            disease. After reviewing the risks and benefits,                            the patient was deemed in satisfactory condition to                            undergo the procedure.  After obtaining informed consent, the endoscope was                            passed under direct vision. Throughout the                            procedure, the patient's blood pressure, pulse, and                            oxygen saturations were monitored continuously. The                            GIF [id number] #[id number] was introduced through the                            mouth, and advanced  to the second part of duodenum.                            The upper GI endoscopy was accomplished without                            difficulty. The patient tolerated the procedure                            well. Scope In: Scope Out: Findings:                 The esophagus was normal. Specifically, no                            dilatation, esophagitis, hiatal hernia or                            resistance passing scope through the EGJ.                           A single erosion with no bleeding and no stigmata                            of recent bleeding was found on the greater                            curvature of the gastric body. Biopsies were taken                            with a cold forceps for histology. (antrum and body                            same jar to r/o H pylori)                           The exam of the stomach was otherwise normal.                           The  cardia and gastric fundus were normal on                            retroflexion.                           The examined duodenum was normal. Complications:            No immediate complications. Estimated Blood Loss:     Estimated blood loss was minimal. Impression:               - Normal esophagus.                           - Gastric erosion with no bleeding and no stigmata                            of recent bleeding. Biopsied.                           - Normal examined duodenum.                           No structural or inflammatory findings to explain                            belching, which is most consistent with                            supra-gastric belching.                           If the biopsies are negative for H pylori, then the                            single gastric erosion is of doubtful clinical                            significance. Recommendation:           - Patient has a contact number available for                            emergencies. The signs and symptoms of  potential                            delayed complications were discussed with the                            patient. Return to normal activities tomorrow.                            Written discharge instructions were provided to the                            patient.                           -  Resume previous diet.                           - Continue present medications.                           - Await pathology results.                           - See the other procedure note for documentation of                            additional recommendations.                           - Follow guidelines provided at office visit re:                            belching management.Diet and lifestyle                            modifications will be much more helpful than                            acid-reducing medicines for this issue. Dijuan Sleeth L. Myrtie Neither, MD 06/11/2023 2:55:45 PM This report has been signed electronically.

## 2023-06-11 NOTE — Progress Notes (Signed)
Patient required Robinul during colonoscopy d/t severe transient bradycardia. MD aware, paused procedure, bradycardia resolved with interventions. No adverse events noted.   Otherwise, uneventful anesthetic. Report to pacu rn. Vss. Care resumed by rn.

## 2023-06-11 NOTE — Progress Notes (Signed)
Pt's fiance stated that they were going to take public transportation, and that he had no control of what they do.  We explained that we could not let the patient go until the transportation was here.  The fiance proceeded to yell at the nurse, transporter and the supervisor. He will let us know when the transportation is here.

## 2023-06-11 NOTE — Progress Notes (Signed)
Patient states there have been no changes to medical or surgical history since time of pre-visit. 

## 2023-06-11 NOTE — Op Note (Signed)
[facility] Patient Name: Kathleen Morse Procedure Date: 06/11/2023 2:14 PM MRN: [id number] Endoscopist: Sherilyn Cooter L. Myrtie Neither , MD, [id number] Age: 72 Referring MD:  Date of Birth: [birth year] Gender: Female Account #: [id number] Procedure:                Colonoscopy Indications:              Screening for colorectal malignant neoplasm,                            Incidental constipation noted                           [place]                           clinical details in 03/27/23 office consult note Medicines:                Monitored Anesthesia Care Procedure:                Pre-Anesthesia Assessment:                           - Prior to the procedure, a History and Physical                            was performed, and patient medications and                            allergies were reviewed. The patient's tolerance of                            previous anesthesia was also reviewed. The risks                            and benefits of the procedure and the sedation                            options and risks were discussed with the patient.                            All questions were answered, and informed consent                            was obtained. Prior Anticoagulants: The patient has                            taken no anticoagulant or antiplatelet agents. ASA                            Grade Assessment: II - A patient with mild systemic                            disease. After reviewing the risks and benefits,  the patient was deemed in satisfactory condition to                            undergo the procedure.                           After obtaining informed consent, the colonoscope                            was passed under direct vision. Throughout the                            procedure, the patient's blood pressure, pulse, and                            oxygen saturations were monitored continuously.  The                            Olympus Scope SN: [id number] was introduced through                            the anus and advanced to the the cecum, identified                            by appendiceal orifice and ileocecal valve. The                            colonoscopy was performed with difficulty due to a                            redundant colon and significant looping. Successful                            completion of the procedure was aided by using                            manual pressure and straightening and shortening                            the scope to obtain bowel loop reduction. The                            patient tolerated the procedure well. The quality                            of the bowel preparation was excellent. The                            ileocecal valve, appendiceal orifice, and rectum                            were photographed. Scope In: 2:30:33 PM Scope Out: 2:48:26 PM Scope Withdrawal Time: 0 hours 11 minutes 45 seconds  Total Procedure Duration: 0 hours  17 minutes 53 seconds  Findings:                 The perianal and digital rectal examinations were                            normal.                           Repeat examination of right colon under NBI                            performed.                           The sigmoid colon was redundant.                           Three sessile and semi-pedunculated polyps were                            found in the recto-sigmoid colon and proximal                            transverse colon. The polyps were 2 to 5 mm in                            size. These polyps were removed with a cold snare.                            Resection and retrieval were complete.                           Diverticula were found in the left colon.                           Internal hemorrhoids were found.                           The exam was otherwise without abnormality on                            direct and  retroflexion views. Complications:            No immediate complications. Estimated Blood Loss:     Estimated blood loss was minimal. Impression:               - Redundant colon.                           - Three 2 to 5 mm polyps at the recto-sigmoid colon                            and in the proximal transverse colon, removed with                            a cold snare. Resected and retrieved.                           -  Diverticulosis in the left colon.                           - Internal hemorrhoids.                           - The examination was otherwise normal on direct                            and retroflexion views. Recommendation:           - Patient has a contact number available for                            emergencies. The signs and symptoms of potential                            delayed complications were discussed with the                            patient. Return to normal activities tomorrow.                            Written discharge instructions were provided to the                            patient.                           - Resume previous diet.                           - Continue present medications.                           - Await pathology results.                           - Repeat colonoscopy may be recommended for                            surveillance. That will be determined after                            pathology results from today's exam become                            available for review.                           - Clinic follow up with the PA Steffanie Dunn) will be                            arranged after your pathology results are available. Marks Scalera L. Myrtie Neither, MD 06/11/2023 3:00:39 PM This report has been signed electronically.

## 2023-06-11 NOTE — Patient Instructions (Addendum)
Resume all of your previous medications today as ordered.  Read all of the handouts given to you by your recovery room nurse.  YOU HAD AN ENDOSCOPIC PROCEDURE TODAY AT THE [facility]:   Refer to the procedure report that was given to you for any specific questions about what was found during the examination.  If the procedure report does not answer your questions, please call your gastroenterologist to clarify.  If you requested that your care partner not be given the details of your procedure findings, then the procedure report has been included in a sealed envelope for you to review at your convenience later.  YOU SHOULD EXPECT: Some feelings of bloating in the abdomen. Passage of more gas than usual.  Walking can help get rid of the air that was put into your GI tract during the procedure and reduce the bloating. If you had a lower endoscopy (such as a colonoscopy or flexible sigmoidoscopy) you may notice spotting of blood in your stool or on the toilet paper. If you underwent a bowel prep for your procedure, you may not have a normal bowel movement for a few days.  Please Note:  You might notice some irritation and congestion in your nose or some drainage.  This is from the oxygen used during your procedure.  There is no need for concern and it should clear up in a day or so.  SYMPTOMS TO REPORT IMMEDIATELY:  Following lower endoscopy (colonoscopy or flexible sigmoidoscopy):  Excessive amounts of blood in the stool  Significant tenderness or worsening of abdominal pains  Swelling of the abdomen that is new, acute  Fever of 100F or higher  Following upper endoscopy (EGD)  Vomiting of blood or coffee ground material  New chest pain or pain under the shoulder blades  Painful or persistently difficult swallowing  New shortness of breath  Fever of 100F or higher  Black, tarry-looking stools  For urgent or emergent issues, a gastroenterologist can be reached at any hour by  calling [phone]. Do not use MyChart messaging for urgent concerns.    DIET:  We do recommend a small meal at first, but then you may proceed to your regular diet.  Drink plenty of fluids but you should avoid alcoholic beverages for 24 hours. Tryto increase the fiber in  your diet, and drink plenty of water.  ACTIVITY:  You should plan to take it easy for the rest of today and you should NOT DRIVE or use heavy machinery until tomorrow (because of the sedation medicines used during the test).    FOLLOW UP: Our staff will call the number listed on your records the next business day following your procedure.  We will call around 7:15- 8:00 am to check on you and address any questions or concerns that you may have regarding the information given to you following your procedure. If we do not reach you, we will leave a message.     If any biopsies were taken you will be contacted by phone or by letter within the next 1-3 weeks.  Please call us at [phone] if you have not heard about the biopsies in 3 weeks.    SIGNATURES/CONFIDENTIALITY: You and/or your care partner have signed paperwork which will be entered into your electronic medical record.  These signatures attest to the fact that that the information above on your After Visit Summary has been reviewed and is understood.  Full responsibility of the confidentiality of this discharge information  lies with you and/or your care-partner.

## 2023-06-11 NOTE — Progress Notes (Signed)
History and Physical:  This patient presents for endoscopic testing for: Encounter Diagnoses  Name Primary?   Gastroesophageal reflux disease without esophagitis Yes   Special screening for malignant neoplasms, colon     GERD, belching, bloating, constipation and CRC screening.  Clinical details in 03/27/23 office consult note, and no interval clinical changes.  Patient is otherwise without complaints or active issues today.   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 Recheck 2-3 years.      Past Surgical History: Past Surgical History:  Procedure Laterality Date   ABDOMINAL HYSTERECTOMY  1983   CESAREAN SECTION  1971    Allergies: Allergies  Allergen Reactions   Pineapple Hives   Penicillins Rash    Outpatient Meds: Current Outpatient Medications  Medication Sig Dispense Refill   amLODipine (NORVASC) 10 MG tablet Take 1 tablet (10 mg total) by mouth daily. 90 tablet 3   b complex vitamins capsule Take 1 capsule by mouth daily.     chlorthalidone (HYGROTON) 25 MG tablet Take 25 mg by mouth daily.     diclofenac Sodium (VOLTAREN) 1 % GEL Apply 2 g topically 4 (four) times daily as needed. 350 g 5   donepezil (ARICEPT) 10 MG tablet Take 1 tablet (10 mg total) by mouth at bedtime. 90 tablet 3   latanoprost (XALATAN) 0.005 % ophthalmic solution INSTILL 1 DROP INTO BOTH EYES AT BEDTIME 3 mL 3   Na Sulfate-K Sulfate-Mg Sulf 17.5-3.13-1.6 GM/177ML SOLN Take as directed 354 mL 0   rosuvastatin (CRESTOR) 20 MG tablet TAKE 1 TABLET (20 MG TOTAL) BY MOUTH AT BEDTIME. 90 tablet 3   albuterol (VENTOLIN HFA) 108 (90 Base) MCG/ACT inhaler INHALE 1 TO 2 PUFFS EVERY 6  HOURS AS NEEDED FOR WHEEZING OR SHORTNESS OF BREATH. 18 g 3   pantoprazole (PROTONIX) 40 MG tablet Take 1 tablet (40 mg total) by mouth daily. 30 tablet 3   senna (SENOKOT) 8.6 MG tablet Take 1 tablet by mouth as needed for constipation. (Patient not taking: Reported on 06/11/2023)     triamcinolone cream (KENALOG) 0.1 % Apply 1 Application topically 2 (two) times daily. For 2 weeks, then as needed 45 g 0   Current Facility-Administered Medications  Medication Dose Route Frequency Provider Last Rate Last Admin   0.9 %  sodium chloride infusion  500 mL Intravenous Continuous Danis, Starr Lake III, MD          ___________________________________________________________________ Objective   Exam:  BP 129/84   Pulse 86   Temp 98.8 F (37.1 C)   SpO2 96%   CV: regular , S1/S2 Resp: clear to auscultation bilaterally, normal RR and effort noted GI: soft, no tenderness, with active bowel sounds.   Assessment: Encounter Diagnoses  Name Primary?   Gastroesophageal reflux disease without esophagitis Yes   Special screening for malignant neoplasms, colon      Plan: Colonoscopy EGD  The benefits and risks of the planned procedure were described in detail with the patient or (when appropriate) their health care proxy.  Risks were outlined as including, but not limited to, bleeding, infection, perforation, adverse medication reaction leading to cardiac or pulmonary decompensation, pancreatitis (if  ERCP).  The limitation of incomplete mucosal visualization was also discussed.  No guarantees or warranties were given.  The patient is appropriate for an endoscopic procedure in the ambulatory setting.   - Amada Jupiter, MD

## 2023-06-12 ENCOUNTER — Telehealth: Payer: Self-pay

## 2023-06-12 NOTE — Telephone Encounter (Signed)
  Follow up Call-     06/11/2023   12:47 PM  Call back number  Post procedure Call Back phone  # [phone]; [phone]  Permission to leave phone message Yes     Patient questions:  Do you have a fever, pain , or abdominal swelling? No. Pain Score  0 *  Have you tolerated food without any problems? Yes.    Have you been able to return to your normal activities? Yes.    Do you have any questions about your discharge instructions: Diet   No. Medications  No. Follow up visit  No.  Do you have questions or concerns about your Care? No.  Actions: * If pain score is 4 or above: No action needed, pain <4.

## 2023-06-16 LAB — SURGICAL PATHOLOGY

## 2023-06-18 ENCOUNTER — Encounter: Payer: Self-pay | Admitting: Gastroenterology

## 2023-06-25 ENCOUNTER — Other Ambulatory Visit: Payer: Self-pay | Admitting: Family Medicine

## 2023-07-04 ENCOUNTER — Encounter: Payer: Self-pay | Admitting: Family Medicine

## 2023-07-30 ENCOUNTER — Ambulatory Visit (INDEPENDENT_AMBULATORY_CARE_PROVIDER_SITE_OTHER): Payer: Medicare HMO | Admitting: Allergy

## 2023-07-30 ENCOUNTER — Encounter: Payer: Self-pay | Admitting: Allergy

## 2023-07-30 ENCOUNTER — Other Ambulatory Visit: Payer: Self-pay

## 2023-07-30 VITALS — BP 110/60 | HR 86 | Ht 59.45 in | Wt 150.3 lb

## 2023-07-30 DIAGNOSIS — T7840XD Allergy, unspecified, subsequent encounter: Secondary | ICD-10-CM

## 2023-07-30 DIAGNOSIS — L299 Pruritus, unspecified: Secondary | ICD-10-CM

## 2023-07-30 DIAGNOSIS — R6889 Other general symptoms and signs: Secondary | ICD-10-CM | POA: Diagnosis not present

## 2023-07-30 DIAGNOSIS — L509 Urticaria, unspecified: Secondary | ICD-10-CM

## 2023-07-30 MED ORDER — EPINEPHRINE 0.3 MG/0.3ML IJ SOAJ: 0.3000 mg | INTRAMUSCULAR | 2 refills | Status: AC | PRN

## 2023-07-30 NOTE — Patient Instructions (Signed)
Lip swelling/Reaction History of lip swelling after ingestion of fennel and another episode after ingestion of Junior Mints (manufactured in a facility that also processes peanuts). No prior history of reactions to these foods. No current EpiPen prescription. -Order blood work to investigate potential allergens as well as other causes of swelling.   -There is no allergy test for fennel thus continue to avoid -Prescribe EpiPen for emergency use in case of accidental ingestion of allergens--demo on how to use provided -Provide education on how to use EpiPen and when to use it--Emergency action plan given -Should significant symptoms recur or new symptoms occur, a journal is to be kept recording any foods eaten, beverages consumed, medications taken, activities performed, and environmental conditions within a 6 hour time period prior to the onset of symptoms. For any symptoms concerning for anaphylaxis, epinephrine is to be administered and 911 is to be called immediately.   Chronic Itching and Bumpy rash History of itching and rash, for several years. Rash presents as small bumps that come and go, leaving behind bruising marks.  -Continue use of triamcinolone cream as needed for itching. -Recommend daily antihistamine, Zyrtec 10mg  daily, to dampen allergic responses and potentially reduce symptoms. -Hives can be caused by a variety of different triggers including illness/infection, foods, medications, stings, exercise, pressure, vibrations, extremes of temperature to name a few however majority of the time there is no identifiable trigger.   -Obtaining labs as above   Follow-up in 4-6 months or sooner if needed Await results of blood work (expected in about one week) and will call with results

## 2023-07-30 NOTE — Progress Notes (Signed)
New Patient Note  RE: Kathleen Morse MRN: [id number] DOB: 1950-11-06 Date of Office Visit: 07/30/2023  Primary care provider: Willow Ora, MD  Chief Complaint: tongue swelling, hives  History of present illness: Kathleen Morse is a 72 y.o. female presenting today for evaluation of angioedema and hives.  She presents today with her husband.    She presents with a history of hives, lip swelling. The lip swelling was reportedly triggered by ingestion of fennel, a new food item in her diet, in October of the previous year. The reaction was immediate, occurring within 3-5 minutes of ingestion.. The patient received both oral medication and an injection at the doctor's office, which effectively reduced the swelling.  She has been avoiding fennel since.  Since then, the patient experienced another lip swelling episode after consuming Junior Mints, which were produced in a facility that also processes peanuts. The patient has since avoided peanuts, despite previously consuming them without issue.  In addition to these swelling episodes, the patient reports a history of itching and rash, which she describes as small bumps that come and go. The rash has been present for several years and appears approximately monthly. The patient uses triamcinolone cream which alleviates the itching but does not resolve the rash. The rash leaves behind bruising marks once it subsides.  The patient's current medication regimen includes amlodipine and rosuvastatin. She denies any changes in medications, detergents, soaps, or body products. The patient's diet primarily consists of chicken and [place], with rare consumption of red meat.  Does not believe she has had any issues with sting's or insect bites.    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 Recheck 2-3 years.     Past surgical history: Past Surgical History:  Procedure Laterality Date   ABDOMINAL HYSTERECTOMY  1983   CESAREAN SECTION  1971    Family history:  Family History  Problem Relation Age of Onset   Asthma Mother    Colon polyps Mother    Arthritis Mother    Birth defects Mother    Diabetes Mother    Depression Father    Breast cancer Sister    Cancer Sister    Colon polyps Sister    Heart attack Maternal Grandmother    Stroke Maternal Grandfather    Colon cancer Neg Hx    Esophageal cancer Neg Hx    Rectal cancer Neg Hx    Stomach cancer Neg Hx     Social history: She lives in an apartment with carpeting with electric heating and central cooling.  No pets in the home.  There is no concern for water damage, mildew or roaches in the home.  She is retired.  She has a smoking history from 1973-1984 5 single cigarettes per day   Medication List: Current Outpatient Medications  Medication Sig Dispense Refill   albuterol (VENTOLIN HFA) 108 (90 Base) MCG/ACT inhaler INHALE 1 TO 2 PUFFS EVERY 6 HOURS AS NEEDED FOR WHEEZING OR SHORTNESS OF BREATH. 18 g 3  amLODipine (NORVASC) 10 MG tablet Take 1 tablet (10 mg total) by mouth daily. 90 tablet 3   b complex vitamins capsule Take 1 capsule by mouth daily.     chlorthalidone (HYGROTON) 25 MG tablet Take 25 mg by mouth daily.     diclofenac Sodium (VOLTAREN) 1 % GEL Apply 2 g topically 4 (four) times daily as needed. 350 g 5   donepezil (ARICEPT) 10 MG tablet Take 1 tablet (10 mg total) by mouth at bedtime. 90 tablet 3   EPINEPHrine (EPIPEN 2-PAK) 0.3 mg/0.3 mL IJ SOAJ injection Inject 0.3 mg into the muscle as needed for  anaphylaxis. 2 each 2   latanoprost (XALATAN) 0.005 % ophthalmic solution INSTILL 1 DROP INTO BOTH EYES AT BEDTIME 3 mL 3   Na Sulfate-K Sulfate-Mg Sulf 17.5-3.13-1.6 GM/177ML SOLN Take as directed 354 mL 0   pantoprazole (PROTONIX) 40 MG tablet Take 1 tablet (40 mg total) by mouth daily. 30 tablet 3   rosuvastatin (CRESTOR) 20 MG tablet TAKE 1 TABLET (20 MG TOTAL) BY MOUTH AT BEDTIME. 90 tablet 3   senna (SENOKOT) 8.6 MG tablet Take 1 tablet by mouth as needed for constipation.     triamcinolone cream (KENALOG) 0.1 % Apply 1 Application topically 2 (two) times daily. For 2 weeks, then as needed 45 g 0   No current facility-administered medications for this visit.    Known medication allergies: Allergies  Allergen Reactions   Pineapple Hives   Penicillins Rash     Physical examination: Blood pressure 110/60, pulse 86, height 4' 11.45" (1.51 m), weight 150 lb 4.8 oz (68.2 kg), SpO2 99%.  General: Alert, interactive, in no acute distress. HEENT: PERRLA, turbinates non-edematous without discharge, post-pharynx non erythematous. Neck: Supple without lymphadenopathy. Lungs: Clear to auscultation without wheezing, rhonchi or rales. {no increased work of breathing. CV: Normal S1, S2 without murmurs. Abdomen: Nondistended, nontender. Skin: Warm and dry, without lesions or rashes. Extremities:  No clubbing, cyanosis or edema. Neuro:   Grossly intact.  Diagnositics/Labs: None today  Assessment and plan: Lip swelling/Reaction History of lip swelling after ingestion of fennel and another episode after ingestion of Junior Mints (manufactured in a facility that also processes peanuts). No prior history of reactions to these foods. No current EpiPen prescription. -Order blood work to investigate potential allergens as well as other causes of swelling.   -There is no allergy test for fennel thus continue to avoid -Prescribe EpiPen for emergency use in case of accidental ingestion of  allergens--demo on how to use provided -Provide education on how to use EpiPen and when to use it--Emergency action plan given -Should significant symptoms recur or new symptoms occur, a journal is to be kept recording any foods eaten, beverages consumed, medications taken, activities performed, and environmental conditions within a 6 hour time period prior to the onset of symptoms. For any symptoms concerning for anaphylaxis, epinephrine is to be administered and 911 is to be called immediately.   Chronic pruritus and urticaria History of itching and rash, for several years. Rash presents as small bumps that come and go, leaving behind bruising marks.  -Continue use of triamcinolone cream as needed for itching. -Recommend daily antihistamine, Zyrtec 10mg  daily, to dampen allergic responses and potentially reduce symptoms. -Hives can be caused by a variety of different triggers including illness/infection, foods, medications, stings, exercise, pressure, vibrations, extremes of temperature to name a few however majority of the time there is no identifiable trigger.   -Obtaining labs as above  Follow-up  in 4-6 months or sooner if needed Await results of blood work (expected in about one week) and will call with results  I appreciate the opportunity to take part in Kathleen Morse's care. Please do not hesitate to contact me with questions.  Sincerely,   Margo Aye, MD Allergy/Immunology [facility]

## 2023-07-30 NOTE — Addendum Note (Signed)
Addended by: Philipp Deputy on: 07/30/2023 05:14 PM   Modules accepted: Orders

## 2023-08-05 LAB — CBC WITH DIFFERENTIAL/PLATELET
Basophils Absolute: 0.1 10*3/uL (ref 0.0–0.2)
Basos: 0 %
EOS (ABSOLUTE): 0.1 10*3/uL (ref 0.0–0.4)
Eos: 1 %
Hematocrit: 42.7 % (ref 34.0–46.6)
Hemoglobin: 13.9 g/dL (ref 11.1–15.9)
Immature Grans (Abs): 0 10*3/uL (ref 0.0–0.1)
Immature Granulocytes: 0 %
Lymphocytes Absolute: 2.6 10*3/uL (ref 0.7–3.1)
Lymphs: 22 %
MCH: 30.1 pg (ref 26.6–33.0)
MCHC: 32.6 g/dL (ref 31.5–35.7)
MCV: 92 fL (ref 79–97)
Monocytes Absolute: 0.6 10*3/uL (ref 0.1–0.9)
Monocytes: 5 %
Neutrophils Absolute: 8.4 10*3/uL — ABNORMAL HIGH (ref 1.4–7.0)
Neutrophils: 72 %
Platelets: 365 10*3/uL (ref 150–450)
RBC: 4.62 x10E6/uL (ref 3.77–5.28)
RDW: 13.1 % (ref 11.7–15.4)
WBC: 11.7 10*3/uL — ABNORMAL HIGH (ref 3.4–10.8)

## 2023-08-05 LAB — ALPHA-GAL PANEL
Allergen Lamb IgE: <0.1 kU/L
Beef IgE: <0.1 kU/L
IgE (Immunoglobulin E), Serum: 1384 [IU]/mL — ABNORMAL HIGH (ref 6–495)
O215-IgE Alpha-Gal: <0.1 kU/L
Pork IgE: <0.1 kU/L

## 2023-08-05 LAB — CHRONIC URTICARIA: cu index: 3.5 (ref <10)

## 2023-08-05 LAB — COMPREHENSIVE METABOLIC PANEL
ALT: 27 IU/L (ref 0–32)
AST: 28 [IU]/L (ref 0–40)
Albumin: 4.5 g/dL (ref 3.8–4.8)
Alkaline Phosphatase: 94 IU/L (ref 44–121)
BUN/Creatinine Ratio: 10 — ABNORMAL LOW (ref 12–28)
BUN: 8 mg/dL (ref 8–27)
Bilirubin Total: 0.3 mg/dL (ref 0.0–1.2)
CO2: 24 mmol/L (ref 20–29)
Calcium: 10.1 mg/dL (ref 8.7–10.3)
Chloride: 99 mmol/L (ref 96–106)
Creatinine, Ser: 0.83 mg/dL (ref 0.57–1.00)
Globulin, Total: 2.9 g/dL (ref 1.5–4.5)
Glucose: 99 mg/dL (ref 70–99)
Potassium: 4 mmol/L (ref 3.5–5.2)
Sodium: 141 mmol/L (ref 134–144)
Total Protein: 7.4 g/dL (ref 6.0–8.5)
eGFR: 75 mL/min/{1.73_m2} (ref >59)

## 2023-08-05 LAB — ALLERGENS W/TOTAL IGE AREA 2
Alternaria Alternata IgE: 5.45 kU/L — AB
Aspergillus Fumigatus IgE: 0.16 kU/L — AB
Bermuda Grass IgE: <0.1 kU/L
Cat Dander IgE: 1.37 kU/L — AB
Cedar, Mountain IgE: 0.23 kU/L — AB
Cladosporium Herbarum IgE: 0.39 kU/L — AB
Cockroach, German IgE: 1.47 kU/L — AB
Common Silver Birch IgE: <0.1 kU/L
Cottonwood IgE: <0.1 kU/L
D Farinae IgE: 3.33 kU/L — AB
D Pteronyssinus IgE: 5.73 kU/L — AB
Dog Dander IgE: 0.32 kU/L — AB
Elm, American IgE: <0.1 kU/L
Johnson Grass IgE: <0.1 kU/L
Maple/Box Elder IgE: <0.1 kU/L
Mouse Urine IgE: <0.1 kU/L
Oak, White IgE: <0.1 kU/L
Pecan, Hickory IgE: <0.1 kU/L
Penicillium Chrysogen IgE: 1.19 kU/L — AB
Pigweed, Rough IgE: 0.11 kU/L — AB
Ragweed, Short IgE: <0.1 kU/L
Sheep Sorrel IgE Qn: <0.1 kU/L
Timothy Grass IgE: <0.1 kU/L
White Mulberry IgE: <0.1 kU/L

## 2023-08-05 LAB — IGE NUT PROF. W/COMPONENT RFLX
F017-IgE Hazelnut (Filbert): <0.1 kU/L
F018-IgE Brazil Nut: <0.1 kU/L
F020-IgE Almond: 0.11 kU/L — AB
F202-IgE Cashew Nut: <0.1 kU/L
F203-IgE Pistachio Nut: <0.1 kU/L
F256-IgE Walnut: <0.1 kU/L
Macadamia Nut, IgE: <0.1 kU/L
Peanut, IgE: <0.1 kU/L
Pecan Nut IgE: <0.1 kU/L

## 2023-08-05 LAB — THYROID ANTIBODIES: Thyroperoxidase Ab SerPl-aCnc: 12 [IU]/mL (ref 0–34)

## 2023-08-05 LAB — TSH: TSH: 2.55 u[IU]/mL (ref 0.450–4.500)

## 2023-08-05 LAB — ANA W/REFLEX: Anti Nuclear Antibody (ANA): NEGATIVE

## 2023-08-05 LAB — SEDIMENTATION RATE: Sed Rate: 25 mm/h (ref 0–40)

## 2023-08-05 LAB — TRYPTASE: Tryptase: 4.7 ug/L (ref 2.2–13.2)

## 2023-08-05 LAB — THYROID ANTIBODIES (THYROPEROXIDASE & THYROGLOBULIN): Thyroglobulin Antibody: <1 [IU]/mL (ref 0.0–0.9)

## 2023-08-07 ENCOUNTER — Other Ambulatory Visit: Payer: Self-pay | Admitting: Family Medicine

## 2023-08-10 DIAGNOSIS — R6889 Other general symptoms and signs: Secondary | ICD-10-CM | POA: Diagnosis not present

## 2023-08-13 ENCOUNTER — Ambulatory Visit: Payer: Medicare HMO | Admitting: Allergy

## 2023-08-17 ENCOUNTER — Other Ambulatory Visit: Payer: Self-pay | Admitting: Family Medicine

## 2023-08-24 ENCOUNTER — Encounter: Payer: Self-pay | Admitting: Family Medicine

## 2023-08-24 ENCOUNTER — Ambulatory Visit (INDEPENDENT_AMBULATORY_CARE_PROVIDER_SITE_OTHER): Payer: Medicare HMO | Admitting: Family Medicine

## 2023-08-24 VITALS — BP 120/64 | HR 111 | Temp 97.7°F | Ht 59.45 in | Wt 153.8 lb

## 2023-08-24 DIAGNOSIS — I10 Essential (primary) hypertension: Secondary | ICD-10-CM | POA: Diagnosis not present

## 2023-08-24 DIAGNOSIS — Z91018 Allergy to other foods: Secondary | ICD-10-CM | POA: Diagnosis not present

## 2023-08-24 DIAGNOSIS — G8929 Other chronic pain: Secondary | ICD-10-CM

## 2023-08-24 DIAGNOSIS — G301 Alzheimer's disease with late onset: Secondary | ICD-10-CM | POA: Diagnosis not present

## 2023-08-24 DIAGNOSIS — K319 Disease of stomach and duodenum, unspecified: Secondary | ICD-10-CM | POA: Diagnosis not present

## 2023-08-24 DIAGNOSIS — F02A0 Dementia in other diseases classified elsewhere, mild, without behavioral disturbance, psychotic disturbance, mood disturbance, and anxiety: Secondary | ICD-10-CM | POA: Diagnosis not present

## 2023-08-24 DIAGNOSIS — M545 Low back pain, unspecified: Secondary | ICD-10-CM | POA: Diagnosis not present

## 2023-08-24 DIAGNOSIS — D126 Benign neoplasm of colon, unspecified: Secondary | ICD-10-CM | POA: Diagnosis not present

## 2023-08-24 DIAGNOSIS — E782 Mixed hyperlipidemia: Secondary | ICD-10-CM | POA: Diagnosis not present

## 2023-08-24 LAB — LIPID PANEL
Cholesterol: 151 mg/dL (ref 0–200)
HDL: 52.4 mg/dL (ref >39.00)
LDL Cholesterol: 78 mg/dL (ref 0–99)
NonHDL: 98.9
Total CHOL/HDL Ratio: 3
Triglycerides: 105 mg/dL (ref 0.0–149.0)
VLDL: 21 mg/dL (ref 0.0–40.0)

## 2023-08-24 MED ORDER — PANTOPRAZOLE SODIUM 40 MG PO TBEC: 40.0000 mg | DELAYED_RELEASE_TABLET | Freq: Every day | ORAL | 3 refills | Status: DC

## 2023-08-24 NOTE — Patient Instructions (Signed)
Please return in 6 months for recheck  I will release your lab results to you on your MyChart account with further instructions. You may see the results before I do, but when I review them I will send you a message with my report or have my assistant call you if things need to be discussed. Please reply to my message with any questions. Thank you!   If you have any questions or concerns, please don't hesitate to send me a message via MyChart or call the office at [phone]. Thank you for visiting with [place] today! It's our pleasure caring for you.   VISIT SUMMARY:  During your follow-up visit, we reviewed your recent allergy test results, memory concerns, hyperlipidemia, and general health. You reported feeling generally well but noted some seasonal laziness. We discussed your current health strategies and made plans to address your ongoing health needs.  YOUR PLAN:  -NUT ALLERGY: You have a confirmed low allergic reaction to cashews, almonds, and some other nuts. Although you haven't had any recent swelling of the lips or tongue, it's important to have an EpiPen available in case of a severe allergic reaction.  -COGNITIVE DECLINE: You experienced night terrors with previous memory medication and prefer not to try another medication. You are using a journal to help with memory and have family support. We will monitor your cognitive status and encourage the continued use of memory aids.  -GASTRITIS: You have stomach inflammation, which can cause symptoms like acid reflux. We will send a prescription for Protonix to [facility] to help manage these symptoms when they occur.  -HYPERLIPIDEMIA: Hyperlipidemia means you have high cholesterol levels. You are on medication and follow a low-meat diet. It's important to recheck your cholesterol levels to ensure your medication is working effectively, so we will order a cholesterol test.  -HYPERTENSION: Your blood pressure is well-controlled with your current  management plan. Continue with your current blood pressure management.  -GENERAL HEALTH MAINTENANCE: You are up to date on your flu and COVID vaccinations. Your recent colonoscopy showed a polyp but no significant concerns, so no further colonoscopies are needed. Continue with routine vaccinations.  INSTRUCTIONS:  Please schedule a follow-up appointment in six months. Additionally, ensure you have an EpiPen available, and get your cholesterol levels checked as ordered.

## 2023-08-24 NOTE — Progress Notes (Signed)
Subjective  CC:  Chief Complaint  Patient presents with   Hypertension   Back Pain    Pt stated that her back pain is doing much better    HPI: Kathleen Morse is a 72 y.o. female who presents to the office today to address the problems listed above in the chief complaint. Discussed the use of AI scribe software for clinical note transcription with the patient, who gave verbal consent to proceed.  History of Present Illness   The patient, with a history of allergies, memory concerns, and hyperlipidemia, presents for a follow-up visit. She recently underwent allergy testing, which revealed a low allergic reaction to cashews, almonds, and some nuts. She has not experienced any recent swelling of the lips or tongue. She reports feeling "pretty good" but describes herself as "lazy," which she attributes to the winter season. I reviewed notes from allergist and full allergy panel work up/lab reports.   Previously, the patient had concerns about her memory and was trialed on medication, which resulted in night terrors. She has since stopped the medication and reports sleeping well. She has adopted a strategy of maintaining a journal to aid her memory and expresses a preference for not trying another medication. She is comfortable with the natural progression of her memory concerns and feels supported by her caregiver.  The patient also has a history of hyperlipidemia and is on cholesterol medication. She reports a diet low in meat, favoring vegetables, fruit, rice, and pasta. She has not had her cholesterol checked in over a year.  She has a history of stomach inflammation and was reportedly prescribed Protonix; reviewed EGD report from October., although she does not currently have this medication. She occasionally experiences symptoms that could be attributed to acid reflux.  The patient has undergone recent endoscopy and colonoscopy, which showed some stomach inflammation and a polyp in  the colon. The polyp was not deemed concerning, and she was advised that she would not need further colonoscopies due to her age and the low risk of colon cancer.      Assessment  1. Mild late onset Alzheimer's dementia without behavioral disturbance, psychotic disturbance, mood disturbance, or anxiety (HCC)   2. Essential hypertension   3. Chronic midline low back pain without sciatica   4. Tubular adenoma of colon   5. Gastropathy   6. Mixed hyperlipidemia   7. Nut allergy      Plan  Assessment and Plan    Nut Allergy Confirmed low allergic reaction to cashews, almonds, and some nuts. No recent episodes of lip or tongue swelling. Discussed the cost and importance of having an EpiPen available. - pt defers EpiPen due to cost  Cognitive Decline Experienced night terrors with previous memory medication. Prefers not to try another medication. Using a journal to aid memory. Family support available. Discussed goal of maintaining independence and natural progression of cognitive decline. Family prepared to provide care as needed. - Monitor cognitive status - Encourage use of memory aids such as a journal - pt and husband decline other medication trials or work up.   Gastritis Previous endoscopy showed stomach inflammation. Not currently on Protonix but could benefit for occasional reflux symptoms. Discussed [facility]. - [facility]  Hyperlipidemia Cholesterol levels not checked in over a year. On cholesterol medication and follows a low-meat diet. Discussed importance of rechecking cholesterol to ensure medication efficacy. - Order cholesterol test - Continue current cholesterol medication  Hypertension Blood pressure is well-controlled. - Continue current blood pressure management  General Health Maintenance Up to date on flu and COVID vaccinations. Recent colonoscopy showed a polyp but no significant concerns. No  further colonoscopies recommended due to age. Discussed low risk of colon cancer given current age and previous findings. - No further colonoscopies needed - Continue routine vaccinations  Follow-up - Schedule follow-up in six months.  02/11/2024  Orders Placed This Encounter  Procedures   Lipid panel   Meds ordered this encounter  Medications   pantoprazole (PROTONIX) 40 MG tablet    Sig: Take 1 tablet (40 mg total) by mouth daily.    Dispense:  90 tablet    Refill:  3      I reviewed the patients updated PMH, FH, and SocHx.    Patient Active Problem List   Diagnosis Date Noted   Mild late onset Alzheimer's dementia without behavioral disturbance, psychotic disturbance, mood disturbance, or anxiety (HCC) 05/25/2023    Priority: High   Essential hypertension 08/25/2019    Priority: High   Mixed hyperlipidemia 08/25/2019    Priority: High   Tubular adenoma of colon 08/24/2023    Priority: Medium    Gastropathy 08/24/2023    Priority: Medium    Habitual alcohol use 12/17/2022    Priority: Medium    Spondylosis of cervical region without myelopathy or radiculopathy 04/09/2020    Priority: Medium    Osteopenia after menopause 12/27/2019    Priority: Medium    Glaucoma 09/30/2019    Priority: Medium    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    amLODipine (NORVASC) 10 MG tablet Take 1 tablet (10 mg total) by mouth daily.   b complex vitamins capsule Take 1 capsule by mouth daily.   chlorthalidone (HYGROTON) 25 MG tablet Take 25 mg by mouth daily.   diclofenac Sodium (VOLTAREN) 1 % GEL Apply 2 g topically 4 (four) times daily as needed.   latanoprost (XALATAN) 0.005 % ophthalmic solution INSTILL 1 DROP INTO BOTH EYES AT BEDTIME   Na Sulfate-K Sulfate-Mg Sulf 17.5-3.13-1.6 GM/177ML SOLN Take as directed   rosuvastatin (CRESTOR) 20 MG tablet TAKE 1 TABLET (20 MG TOTAL) BY MOUTH AT BEDTIME.   senna (SENOKOT) 8.6 MG tablet Take 1 tablet by mouth as needed for constipation.   triamcinolone cream (KENALOG) 0.1 % APPLY TOPICALLY TWICE DAILY FOR 2 WEEKS, THEN AS NEEDED   [DISCONTINUED] pantoprazole (PROTONIX) 40 MG tablet Take 1 tablet (40 mg total) by mouth daily.    Allergies: Patient is allergic to pineapple and penicillins. Family History: Patient family history includes Arthritis in her mother; Asthma in her mother; Birth defects in her mother; Breast cancer in her sister; Cancer in her sister; Colon polyps in her mother and  sister; Depression in her father; Diabetes in her mother; Heart attack in her maternal grandmother; Stroke in her maternal grandfather. Social History:  Patient  reports that she has quit smoking. Her smoking use included cigarettes. She has been exposed to tobacco smoke. She has never used smokeless tobacco. She reports current alcohol use of about 1.0 standard drink of alcohol per week. She reports that she does not use drugs.  Review of Systems: Constitutional: Negative for fever malaise or anorexia Cardiovascular: negative for chest pain Respiratory: negative for SOB or persistent cough Gastrointestinal: negative for abdominal pain  Objective  Vitals: BP 120/64   Pulse (!) 111   Temp 97.7 F (36.5 C)   Ht 4' 11.45" (1.51 m)   Wt 153 lb 12.8 oz (69.8 kg)   SpO2 96%   BMI 30.60 kg/m  General: no acute  distress , A&Ox3 HEENT: PEERL, conjunctiva normal, neck is supple Cardiovascular:  RRR without murmur or gallop.  Respiratory:  Good breath sounds bilaterally, CTAB with normal respiratory effort Skin:  Warm, no rashes  Commons side effects, risks, benefits, and alternatives for medications and treatment plan prescribed today were discussed, and the patient expressed understanding of the given instructions. Patient is instructed to call or message via MyChart if he/she has any questions or concerns regarding our treatment plan. No barriers to understanding were identified. We discussed Red Flag symptoms and signs in detail. Patient expressed understanding regarding what to do in case of urgent or emergency type symptoms.  Medication list was reconciled, printed and provided to the patient in AVS. Patient instructions and summary information was reviewed with the patient as documented in the AVS. This note was prepared with assistance of Dragon voice recognition software. Occasional wrong-word or sound-a-like substitutions may have occurred due to the inherent limitations of voice recognition software

## 2023-08-25 NOTE — Progress Notes (Signed)
See mychart note Dear [name], Your cholesterol levels are perfect.  Happy holidays. Sincerely, Dr. Mardelle Matte

## 2023-09-15 ENCOUNTER — Other Ambulatory Visit: Payer: Self-pay | Admitting: Family Medicine

## 2023-10-02 ENCOUNTER — Other Ambulatory Visit: Payer: Self-pay | Admitting: Family Medicine

## 2023-11-18 DIAGNOSIS — H401134 Primary open-angle glaucoma, bilateral, indeterminate stage: Secondary | ICD-10-CM | POA: Diagnosis not present

## 2023-11-27 ENCOUNTER — Ambulatory Visit (INDEPENDENT_AMBULATORY_CARE_PROVIDER_SITE_OTHER): Payer: Medicare HMO | Admitting: Allergy

## 2023-11-27 ENCOUNTER — Encounter: Payer: Self-pay | Admitting: Allergy

## 2023-11-27 VITALS — BP 128/68 | HR 102 | Temp 98.2°F | Resp 16

## 2023-11-27 DIAGNOSIS — T7840XD Allergy, unspecified, subsequent encounter: Secondary | ICD-10-CM

## 2023-11-27 DIAGNOSIS — L299 Pruritus, unspecified: Secondary | ICD-10-CM

## 2023-11-27 DIAGNOSIS — L509 Urticaria, unspecified: Secondary | ICD-10-CM | POA: Diagnosis not present

## 2023-11-27 DIAGNOSIS — R062 Wheezing: Secondary | ICD-10-CM | POA: Diagnosis not present

## 2023-11-27 MED ORDER — ALBUTEROL SULFATE HFA 108 (90 BASE) MCG/ACT IN AERS: INHALATION_SPRAY | RESPIRATORY_TRACT | 1 refills | Status: DC

## 2023-11-27 NOTE — Progress Notes (Signed)
 Follow-up Note  RE: [name] MRN: [id number] DOB: 05-09-1951 Date of Office Visit: 11/27/2023   History of present illness: Kathleen Morse is a 73 y.o. female presenting today for follow-up of angioedema, itching and rash.  She was last seen in the office on 07/30/23 by myself.   Discussed the use of AI scribe software for clinical note transcription with the patient, who gave verbal consent to proceed.  She has not experienced further episodes of lip swelling since November. She has been avoiding foods with fennel and has not consumed the specific candy that previously caused issues.  She states the only candies she really eats are butterscotch and peppermint candies.  Recent lab work indicated positive results for environmental allergens including dust mites, cat, dog, cockroach, tree pollen, and weed pollen. She does not have pets, and her husband maintains a clean house. She also had a positive result for almond in her allergy testing and has stopped consuming almonds and peanuts and in general isnt consuming any nuts..  There is improvement in itching and bumpy rash since November, using triamcinolone cream as prescribed. She takes Benadryl for allergy symptoms but does not use Zyrtec or Allegra.  She experiences occasional wheezing, particularly with exertion, but it resolves with rest. She has an albuterol inhaler that she uses occasionally. She is unsure of the age of the inhaler.  Review of systems: 10pt ROS negative unless noted above in HPI   Past medical/social/surgical/family history have been reviewed and are unchanged unless specifically indicated below.  No changes  Medication List: Current Outpatient Medications  Medication Sig Dispense Refill   amLODipine (NORVASC) 10 MG tablet Take 1 tablet (10 mg total) by mouth daily. 90 tablet 3   b complex vitamins capsule Take 1 capsule by mouth daily.     chlorthalidone (HYGROTON) 25 MG tablet TAKE 1  TABLET (25 MG TOTAL) BY MOUTH DAILY. 90 tablet 1   diclofenac Sodium (VOLTAREN) 1 % GEL Apply 2 g topically 4 (four) times daily as needed. 350 g 5   EPINEPHrine (EPIPEN 2-PAK) 0.3 mg/0.3 mL IJ SOAJ injection Inject 0.3 mg into the muscle as needed for anaphylaxis. 2 each 2   latanoprost (XALATAN) 0.005 % ophthalmic solution INSTILL 1 DROP INTO BOTH EYES AT BEDTIME 2.5 mL 3   pantoprazole (PROTONIX) 40 MG tablet Take 1 tablet (40 mg total) by mouth daily. 90 tablet 3   rosuvastatin (CRESTOR) 20 MG tablet TAKE 1 TABLET (20 MG TOTAL) BY MOUTH AT BEDTIME. 90 tablet 3   triamcinolone cream (KENALOG) 0.1 % APPLY TOPICALLY TWICE DAILY FOR 2 WEEKS, THEN AS NEEDED 45 g 3   No current facility-administered medications for this visit.     Known medication allergies: Allergies  Allergen Reactions   Pineapple Hives   Penicillins Rash     Physical examination: Blood pressure 128/68, pulse (!) 102, temperature 98.2 F (36.8 C), temperature source Temporal, resp. rate 16, SpO2 98%.  General: Alert, interactive, in no acute distress. HEENT: PERRLA, TMs pearly gray, turbinates minimally edematous without discharge, post-pharynx non erythematous. Neck: Supple without lymphadenopathy. Lungs: Clear to auscultation without wheezing, rhonchi or rales. {no increased work of breathing. CV: Normal S1, S2 without murmurs. Abdomen: Nondistended, nontender. Skin: Warm and dry, without lesions or rashes. Extremities:  No clubbing, cyanosis or edema. Neuro:   Grossly intact.  Diagnositics/Labs: Labs:  Component     Latest Ref Rng 07/30/2023  D Pteronyssinus IgE     Class IV kU/L  5.73 !   D Farinae IgE     Class III kU/L 3.33 !   Cat Dander IgE     Class II kU/L 1.37 !   Dog Dander IgE     Class I kU/L 0.32 !   [place] Grass IgE     Class 0 kU/L <0.10   Timothy Grass IgE     Class 0 kU/L <0.10   [place] Grass IgE     Class 0 kU/L <0.10   Cockroach, German IgE     Class III kU/L 1.47 !    Penicillium Chrysogen IgE     Class II kU/L 1.19 !   Cladosporium Herbarum IgE     Class I kU/L 0.39 !   Aspergillus Fumigatus IgE     Class 0/I kU/L 0.16 !   Alternaria Alternata IgE     Class IV kU/L 5.45 !   Maple/Box Elder IgE     Class 0 kU/L <0.10   Common Silver Charletta Cousin IgE     Class 0 kU/L <0.10   [place] IgE     Class 0/I kU/L 0.23 !   Oak, White IgE     Class 0 kU/L <0.10   Elm, American IgE     Class 0 kU/L <0.10   [place] IgE     Class 0 kU/L <0.10   Pecan, [place]ory IgE     Class 0 kU/L <0.10   White Mulberry IgE     Class 0 kU/L <0.10   Ragweed, Short IgE     Class 0 kU/L <0.10   Pigweed, Rough IgE     Class 0/I kU/L 0.11 !   Sheep Sorrel IgE Qn     Class 0 kU/L <0.10   Mouse Urine IgE     Class 0 kU/L <0.10   WBC     3.4 - 10.8 x10E3/uL 11.7 (H)   RBC     3.77 - 5.28 x10E6/uL 4.62   Hemoglobin     11.1 - 15.9 g/dL 16.1   HCT     09.6 - 04.5 % 42.7   MCV     79 - 97 fL 92   MCH     26.6 - 33.0 pg 30.1   MCHC     31.5 - 35.7 g/dL 40.9   RDW     81.1 - 91.4 % 13.1   Platelets     150 - 450 x10E3/uL 365   Neutrophils     Not Estab. % 72   Lymphs     Not Estab. % 22   Monocytes     Not Estab. % 5   Eos     Not Estab. % 1   Basos     Not Estab. % 0   NEUT#     1.4 - 7.0 x10E3/uL 8.4 (H)   Lymphs Abs     0.7 - 3.1 x10E3/uL 2.6   Monocytes Absolute     0.1 - 0.9 x10E3/uL 0.6   EOS (ABSOLUTE)     0.0 - 0.4 x10E3/uL 0.1   Basophils Absolute     0.0 - 0.2 x10E3/uL 0.1   Immature Granulocytes     Not Estab. % 0   Immature Grans (Abs)     0.0 - 0.1 x10E3/uL 0.0   Glucose     70 - 99 mg/dL 99   BUN     8 - 27 mg/dL 8   Creatinine     7.82 -  1.00 mg/dL 6.38   eGFR     >75 IE/PPI/9.51 75   BUN/Creatinine Ratio     12 - 28  10 (L)   Sodium     134 - 144 mmol/L 141   Potassium     3.5 - 5.2 mmol/L 4.0   Chloride     96 - 106 mmol/L 99   CO2     20 - 29 mmol/L 24   Calcium     8.7 - 10.3 mg/dL 88.4   Total Protein      6.0 - 8.5 g/dL 7.4   Albumin     3.8 - 4.8 g/dL 4.5   Globulin, Total     1.5 - 4.5 g/dL 2.9   Total Bilirubin     0.0 - 1.2 mg/dL 0.3   Alkaline Phosphatase     44 - 121 IU/L 94   AST     0 - 40 IU/L 28   ALT     0 - 32 IU/L 27   Class Description Allergens Comment   F017-IgE Hazelnut ([place])     Class 0 kU/L <0.10   F256-IgE Walnut     Class 0 kU/L <0.10   F202-IgE Cashew Nut     Class 0 kU/L <0.10   F018-IgE [place] Nut     Class 0 kU/L <0.10   Peanut, IgE     Class 0 kU/L <0.10   Macadamia Nut, IgE     Class 0 kU/L <0.10   Pecan Nut IgE     Class 0 kU/L <0.10   F203-IgE Pistachio Nut     Class 0 kU/L <0.10   F020-IgE Almond     Class 0/I kU/L 0.11 !   IgE (Immunoglobulin E), Serum     6 - 495 IU/mL 1,384 (H)   Pork IgE     Class 0 kU/L <0.10   Beef IgE     Class 0 kU/L <0.10   Allergen Lamb IgE     Class 0 kU/L <0.10   O215-IgE Alpha-Gal     Class 0 kU/L <0.10   Thyroperoxidase Ab SerPl-aCnc     0 - 34 IU/mL 12   Thyroglobulin Antibody     0.0 - 0.9 IU/mL <1.0   Anti Nuclear Antibody (ANA)     Negative  Negative   Sed Rate     0 - 40 mm/hr 25   Tryptase     2.2 - 13.2 ug/L 4.7   cu index     <10  3.5   TSH     0.450 - 4.500 uIU/mL 2.550     Spirometry: FEV1: 1.25L 83%, FVC: 1.58L 83%, ratio consistent with nonobstructive pattern  Assessment and plan:   Lip swelling/Reaction History of lip swelling after ingestion of fennel and another episode after ingestion of Junior Mints (manufactured in a facility that also processes peanuts).  - labwork revealed detectable IgE to dust mites, cat dander, cockroach, dog dander, tree pollen, weed pollen.    Nut IgE panel show low IgE to almond continue avoidance of almonds in diet.  -There is no allergy test for fennel thus continue to avoid -Have access to EpiPen for emergency use in case of accidental ingestion of allergens -Should significant symptoms recur or new symptoms occur, a journal is to be kept  recording any foods eaten, beverages consumed, medications taken, activities performed, and environmental conditions within a 6 hour time period prior to the onset of symptoms. For  any symptoms concerning for anaphylaxis, epinephrine is to be administered and 911 is to be called immediately.   Chronic Itching and Bumpy rash- improved History of itching and rash, for several years. Rash presents as small bumps that come and go, leaving behind bruising marks.  -Continue use of triamcinolone cream as needed for itching. -Recommend daily antihistamine, Zyrtec 10mg  daily, to dampen allergic responses and potentially reduce symptoms. -Hives can be caused by a variety of different triggers including illness/infection, foods, medications, stings, exercise, pressure, vibrations, extremes of temperature to name a few however majority of the time there is no identifiable trigger.    Wheezing Likely allergen driven -Have access to albuterol inhaler 2 puffs every 4-6 hours as needed for cough/wheeze/shortness of breath/chest tightness.  May use 15-20 minutes prior to activity.   Monitor frequency of use.     Follow-up in 4-6 months or sooner if needed.  I appreciate the opportunity to take part in Ura's care. Please do not hesitate to contact me with questions.  Sincerely,   Margo Aye, MD Allergy/Immunology [facility]

## 2023-11-27 NOTE — Patient Instructions (Addendum)
 Lip swelling/Reaction History of lip swelling after ingestion of fennel and another episode after ingestion of Junior Mints (manufactured in a facility that also processes peanuts).  - labwork revealed detectable IgE to dust mites, cat dander, cockroach, dog dander, tree pollen, weed pollen.    Nut IgE panel show low IgE to almond continue avoidance of almonds in diet.  -There is no allergy test for fennel thus continue to avoid -Have access to EpiPen for emergency use in case of accidental ingestion of allergens -Should significant symptoms recur or new symptoms occur, a journal is to be kept recording any foods eaten, beverages consumed, medications taken, activities performed, and environmental conditions within a 6 hour time period prior to the onset of symptoms. For any symptoms concerning for anaphylaxis, epinephrine is to be administered and 911 is to be called immediately.   Chronic Itching and Bumpy rash- improved History of itching and rash, for several years. Rash presents as small bumps that come and go, leaving behind bruising marks.  -Continue use of triamcinolone cream as needed for itching. -Recommend daily antihistamine, Zyrtec 10mg  daily, to dampen allergic responses and potentially reduce symptoms. -Hives can be caused by a variety of different triggers including illness/infection, foods, medications, stings, exercise, pressure, vibrations, extremes of temperature to name a few however majority of the time there is no identifiable trigger.    Wheezing Likely allergen driven -Have access to albuterol inhaler 2 puffs every 4-6 hours as needed for cough/wheeze/shortness of breath/chest tightness.  May use 15-20 minutes prior to activity.   Monitor frequency of use.     Follow-up in 4-6 months or sooner if needed

## 2023-12-17 ENCOUNTER — Other Ambulatory Visit: Payer: Self-pay | Admitting: Family Medicine

## 2024-02-22 ENCOUNTER — Ambulatory Visit: Payer: Medicare HMO | Admitting: Family Medicine

## 2024-02-25 ENCOUNTER — Ambulatory Visit (INDEPENDENT_AMBULATORY_CARE_PROVIDER_SITE_OTHER): Admitting: Family Medicine

## 2024-02-25 ENCOUNTER — Encounter: Payer: Self-pay | Admitting: Family Medicine

## 2024-02-25 VITALS — BP 116/69 | HR 101 | Temp 97.8°F | Ht 59.45 in | Wt 146.2 lb

## 2024-02-25 DIAGNOSIS — G301 Alzheimer's disease with late onset: Secondary | ICD-10-CM | POA: Diagnosis not present

## 2024-02-25 DIAGNOSIS — Z1231 Encounter for screening mammogram for malignant neoplasm of breast: Secondary | ICD-10-CM | POA: Diagnosis not present

## 2024-02-25 DIAGNOSIS — K319 Disease of stomach and duodenum, unspecified: Secondary | ICD-10-CM

## 2024-02-25 DIAGNOSIS — E782 Mixed hyperlipidemia: Secondary | ICD-10-CM | POA: Diagnosis not present

## 2024-02-25 DIAGNOSIS — F02A0 Dementia in other diseases classified elsewhere, mild, without behavioral disturbance, psychotic disturbance, mood disturbance, and anxiety: Secondary | ICD-10-CM | POA: Diagnosis not present

## 2024-02-25 DIAGNOSIS — I10 Essential (primary) hypertension: Secondary | ICD-10-CM | POA: Diagnosis not present

## 2024-02-25 MED ORDER — BLOOD PRESSURE CUFF MISC: 0 refills | Status: DC

## 2024-02-25 NOTE — Progress Notes (Signed)
 Subjective  CC:  Chief Complaint  Patient presents with   Hypertension   Dementia   lump on neck    Pt stated that she has had a lump on the Rt side of her neck for over 16years and it has been hurting and the pain would radiate down her rt arm and she wanted to see about getting it removed.    Hyperlipidemia    HPI: Kathleen Morse is a 73 y.o. female who presents to the office today to address the problems listed above in the chief complaint. Discussed the use of AI scribe software for clinical note transcription with the patient, who gave verbal consent to proceed.  History of Present Illness Kathleen Morse is a 73 year old female who presents for a follow-up visit regarding her gastrointestinal symptoms and blood pressure management.  Her gastrointestinal symptoms have improved, with less frequent stomach discomfort. She previously used Protonix  for heartburn but discontinued it due to headaches. She manages her symptoms by maintaining regular bowel movements, which she believes alleviates her discomfort. No significant heartburn currently.  She occasionally consumes alcohol, specifically champagne, and enjoys mimosas, but denies daily alcohol consumption.  For blood pressure management, she is taking chlorthalidone . Although amlodipine  was mentioned in her records, she has never taken it. She monitors her diet by avoiding salty foods and primarily consuming fruits and vegetables. She does not feel any significant changes in her blood pressure.  She previously tried Aricept  for memory issues but did not tolerate it well. She prefers not to take any medication for memory at this time and uses a journal to help with memory retention. Memory concerns: Addressed in the past. Worsening. Strong family history of Alzheimer's disease, both parents and now sister. Patient having trouble with remembering recent conversations. Gets confused on days of the week etc. Long-term  memory seems to be stable. No loss of function. She lives with her husband who is with her all the time. Has history of habitual alcohol use but she has cut this down to almost nothing now. Her sister is now in assisted living due to dementia. This stresses her greatly. Both parents died from dementia   No significant heartburn currently and occasional alcohol consumption. No significant changes in her blood pressure. Failed protonix  due to headaches. Feels sxs are manageable at this time.    Assessment  1. Essential hypertension   2. Mixed hyperlipidemia   3. Mild late onset Alzheimer's dementia without behavioral disturbance, psychotic disturbance, mood disturbance, or anxiety (HCC)   4. Screening mammogram for breast cancer   5. Gastropathy      Plan  Assessment and Plan Assessment & Plan Gastroesophageal Reflux Disease (GERD) Reduced heartburn frequency. Protonix  discontinued due to headaches. Prefers dietary adjustments. - Monitor symptoms.  Hypertension Blood pressure controlled with chlorthalidone . Amlodipine  not taken. Diet monitored for salt intake. Normal blood pressure. - Continue chlorthalidone . - Order home blood pressure monitor. - Recheck blood pressure in six months.  Cognitive Decline Aricept  not tolerated. Prefers journaling for memory. Cognition stable per husband. Pt defers further evaluation. Has not had brain imaging.  Lab Results  Component Value Date   VITAMINB12 656 06/04/2022   Lab Results  Component Value Date   TSH 2.550 07/30/2023    General Health Maintenance Regular exercise, social interaction, limited alcohol, balanced diet. - Recommend scheduling a mammogram.  Patient is overdue.  Last mammogram January 2024.  Husband said he will call to schedule    Follow  up: 6 mo for cpe and f/u Orders Placed This Encounter  Procedures   MM DIGITAL SCREENING BILATERAL   Meds ordered this encounter  Medications   Blood Pressure Monitoring (BLOOD  PRESSURE CUFF) MISC    Sig: Use as directed    Dispense:  1 each    Refill:  0     I reviewed the patients updated PMH, FH, and SocHx.  Patient Active Problem List   Diagnosis Date Noted   Mild late onset Alzheimer's dementia without behavioral disturbance, psychotic disturbance, mood disturbance, or anxiety (HCC) 05/25/2023    Priority: High   Essential hypertension 08/25/2019    Priority: High   Mixed hyperlipidemia 08/25/2019    Priority: High   Tubular adenoma of colon 08/24/2023    Priority: Medium    Gastropathy 08/24/2023    Priority: Medium    Habitual alcohol use 12/17/2022    Priority: Medium    Spondylosis of cervical region without myelopathy or radiculopathy 04/09/2020    Priority: Medium    Osteopenia after menopause 12/27/2019    Priority: Medium    Glaucoma 09/30/2019    Priority: Medium    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 09/30/2019    Priority: Medium    Obesity (BMI 30-39.9) 09/30/2019    Priority: Medium    Moderate persistent asthma 08/25/2019    Priority: Medium    Laxative habit 12/17/2022    Priority: Low   Constipation 12/17/2022    Priority: Low   Intrinsic eczema 04/09/2020    Priority: Low   Lipoma of neck 04/09/2020    Priority: Low   Chronic midline low back pain without sciatica 09/30/2019    Priority: Low   Nut allergy 08/24/2023   Current Meds  Medication Sig   albuterol  (VENTOLIN  HFA) 108 (90 Base) MCG/ACT inhaler INHALE 1 TO 2 PUFFS EVERY 6 HOURS AS NEEDED FOR WHEEZING OR SHORTNESS OF BREATH.   b complex vitamins capsule Take 1 capsule by mouth daily.   Blood Pressure Monitoring (BLOOD PRESSURE CUFF) MISC Use as directed   chlorthalidone  (HYGROTON ) 25 MG tablet TAKE 1 TABLET (25 MG TOTAL) BY MOUTH DAILY.   diclofenac  Sodium (VOLTAREN ) 1 % GEL Apply 2 g topically 4 (four) times daily as needed.   EPINEPHrine  (EPIPEN  2-PAK) 0.3 mg/0.3 mL IJ SOAJ injection Inject 0.3 mg into the muscle as needed for  anaphylaxis.   latanoprost  (XALATAN ) 0.005 % ophthalmic solution INSTILL 1 DROP INTO BOTH EYES AT BEDTIME   rosuvastatin  (CRESTOR ) 20 MG tablet TAKE 1 TABLET (20 MG TOTAL) BY MOUTH AT BEDTIME.   triamcinolone  cream (KENALOG ) 0.1 % APPLY TOPICALLY TWICE DAILY FOR 2 WEEKS, THEN AS NEEDED   Allergies: Patient is allergic to pineapple and penicillins. Family History: Patient family history includes Arthritis in her mother; Asthma in her mother; Birth defects in her mother; Breast cancer in her sister; Cancer in her sister; Colon polyps in her mother and sister; Depression in her father; Diabetes in her mother; Heart attack in her maternal grandmother; Stroke in her maternal grandfather. Social History:  Patient  reports that she has quit smoking. Her smoking use included cigarettes. She has been exposed to tobacco smoke. She has never used smokeless tobacco. She reports current alcohol use of about 1.0 standard drink of alcohol per week. She reports that she does not use drugs.  Review of Systems: Constitutional: Negative for fever malaise or anorexia Cardiovascular: negative for chest pain Respiratory: negative for SOB or persistent cough Gastrointestinal:  negative for abdominal pain  Objective  Vitals: BP 116/69   Pulse (!) 101   Temp 97.8 F (36.6 C)   Ht 4' 11.45 (1.51 m)   Wt 146 lb 3.2 oz (66.3 kg)   SpO2 97%   BMI 29.08 kg/m  General: no acute distress , A&Ox3 HEENT: PEERL, conjunctiva normal, neck is supple Cardiovascular:  RRR without murmur or gallop.  No edema Respiratory:  Good breath sounds bilaterally, CTAB with normal respiratory effort Skin:  Warm, no rashes Commons side effects, risks, benefits, and alternatives for medications and treatment plan prescribed today were discussed, and the patient expressed understanding of the given instructions. Patient is instructed to call or message via MyChart if he/she has any questions or concerns regarding our treatment plan. No  barriers to understanding were identified. We discussed Red Flag symptoms and signs in detail. Patient expressed understanding regarding what to do in case of urgent or emergency type symptoms.  Medication list was reconciled, printed and provided to the patient in AVS. Patient instructions and summary information was reviewed with the patient as documented in the AVS. This note was prepared with assistance of Dragon voice recognition software. Occasional wrong-word or sound-a-like substitutions may have occurred due to the inherent limitations of voice recognition software

## 2024-02-25 NOTE — Patient Instructions (Addendum)
 Please return in 6 months for your annual complete physical; please come fasting.  For follow up on chronic medical conditions   Check your home blood pressures. We want it always to be < 140/90  If you have any questions or concerns, please don't hesitate to send me a message via MyChart or call the office at [phone]. Thank you for visiting with us  today! It's our pleasure caring for you.   Please call the office checked below to schedule your appointment for your mammogram and/or bone density screen (the checked studies were ordered): [x]   Mammogram  []   Bone Density  [x]   [facility]     [address] [place], [place]        [phone]         []   [facility]  [address] [place], [place]  [phone]   Dementia Dementia is a condition that affects the way the brain works. It often affects thinking and memory.  There are many types of dementia, including: Alzheimer's disease. This is the most common type. Vascular dementia. This type may happen due to a stroke. Lewy body dementia. This type may happen to people who have Parkinson's disease. Frontotemporal dementia. This type is caused by damage to nerve cells in certain parts of the brain. Some people may have more than one type. What are the causes? Dementia is caused by damage to cells in the brain. Some causes that can't be reversed include: Having a condition that affects the blood vessels of the brain. This may be diabetes or heart disease. Changes to genes. Some causes that can be reversed or slowed down include: Injury to the brain due to: A growth called a tumor. A blood clot. Too much fluid in the brain. Taking certain medicines. An infection. Problems with your thyroid . Not having enough vitamin B12 in the body. Having a disease that causes your body's defense system, called the immune system, to attack healthy parts of your body. What are the signs or  symptoms? Symptoms of dementia start slowly and get worse with time. They may include: Problems remembering events or people. Getting lost easily. Forgetting appointments or to pay bills. Having trouble taking a bath or putting clothes on. Having trouble planning and making meals. Having trouble speaking. Changes in behavior or mood. How is this diagnosed? Dementia may be diagnosed based on: Your symptoms and medical history. A physical exam. Tests. These may include: Tests to check your thinking and memory to see how your brain is working. Lab tests. You may have tests on your blood or pee (urine). Imaging tests, such as a CT scan, a PET scan, or an MRI. Genetic testing. This may be done if other family members have had dementia. Your health care provider will talk with you and your family, friends, or caregivers about your history and symptoms. How is this treated? Treatment depends on the cause of the dementia and should start as soon as possible. It might include: Taking medicines for symptoms. Taking medicines to help control or slow down the dementia. Treating the cause of your dementia. Your provider can help you find support groups and other members of the health care team who can help with your care. Follow these instructions at home: Medicines Take medicines only as told by your provider. Use a pill organizer or pill reminder to help you keep track of your medicines. Avoid taking medicines for pain  or for sleep. These can affect your thinking. Lifestyle Make healthy choices. Be active as told by your provider. Do not smoke, vape, or use products with nicotine or tobacco in them. If you need help quitting, talk with your provider. Do not drink alcohol. When you feel a lot of stress, do something that helps you relax. Your provider can give you tips. Spend time with other people. Make sure you get good sleep at night. These tips can help: Try not to take naps during the  day. Keep your bedroom dark and cool. Do not exercise in the few hours before you go to bed. Do not have foods or drinks with caffeine at night. Eating and drinking Drink enough fluid to keep your pee pale yellow. Eat a healthy diet. General instructions  Talk with your provider to decide on: What things you need help with. What your safety needs are. Ask your provider if it's safe for you to drive. If told, wear a bracelet that tracks where you are or shows that you're a person with memory loss. Work with your family to make big legal or health decisions. This may include things like advance directives, medical power of attorney, or a living will. Where to find more information Alzheimer's Association: WesternTunes.it [employer] on Aging: BaseRingTones.pl World Health Organization: VisitDestination.com.br Contact a health care provider if: You have any new symptoms. Your symptoms get worse. You have problems with swallowing. Get help right away if: You feel very sad or feel like you may hurt yourself or others. You have thoughts about taking your own life. Your family members are worried about your safety. These symptoms may be an emergency. Take one of these steps right away: Go to your nearest emergency room. Call 911. Call the National Suicide Prevention Lifeline at 8384783486 or 988. Text the Crisis Text Line at 321-381-3363. This information is not intended to replace advice given to you by your health care provider. Make sure you discuss any questions you have with your health care provider. Document Revised: 06/25/2023 Document Reviewed: 11/10/2022 Elsevier Patient Education  2024 [employer].

## 2024-02-29 ENCOUNTER — Ambulatory Visit (INDEPENDENT_AMBULATORY_CARE_PROVIDER_SITE_OTHER)

## 2024-02-29 VITALS — BP 122/82 | HR 97 | Temp 97.8°F | Ht <= 58 in | Wt 149.0 lb

## 2024-02-29 DIAGNOSIS — Z0000 Encounter for general adult medical examination without abnormal findings: Secondary | ICD-10-CM

## 2024-02-29 NOTE — Progress Notes (Signed)
 Subjective:   Kathleen Morse is a 73 y.o. who presents for a Medicare Wellness preventive visit.  As a reminder, Annual Wellness Visits don't include a physical exam, and some assessments may be limited, especially if this visit is performed virtually. We may recommend an in-person follow-up visit with your provider if needed.  Visit Complete: In person    Persons Participating in Visit: Patient.  AWV Questionnaire: No: Patient Medicare AWV questionnaire was not completed prior to this visit.  Cardiac Risk Factors include: advanced age (>61men, >67 women);dyslipidemia;obesity (BMI >30kg/m2);hypertension     Objective:    Today's Vitals   02/29/24 0830  BP: 122/82  Pulse: 97  Temp: 97.8 F (36.6 C)  SpO2: 99%  Weight: 149 lb (67.6 kg)  Height: 4' 7 (1.397 m)   Body mass index is 34.63 kg/m.     02/29/2024    8:41 AM 02/05/2023   10:06 AM 01/30/2022    9:16 AM 01/24/2021    8:58 AM 11/13/2020    1:04 PM 12/19/2019    1:43 PM  Advanced Directives  Does Patient Have a Medical Advance Directive? No No Yes Yes Yes No  Type of Chief of Staff of [employer] Power of Attorney   Does patient want to make changes to medical advance directive?     No - Patient declined   Copy of Healthcare Power of Attorney in Chart?   No - copy requested No - copy requested No - copy requested   Would patient like information on creating a medical advance directive? No - Patient declined Yes (MAU/Ambulatory/Procedural Areas - Information given)    Yes (MAU/Ambulatory/Procedural Areas - Information given)    Current Medications (verified) Outpatient Encounter Medications as of 02/29/2024  Medication Sig   albuterol  (VENTOLIN  HFA) 108 (90 Base) MCG/ACT inhaler INHALE 1 TO 2 PUFFS EVERY 6 HOURS AS NEEDED FOR WHEEZING OR SHORTNESS OF BREATH.   b complex vitamins capsule Take 1 capsule by mouth daily.   Blood Pressure Monitoring  (BLOOD PRESSURE CUFF) MISC Use as directed   chlorthalidone  (HYGROTON ) 25 MG tablet TAKE 1 TABLET (25 MG TOTAL) BY MOUTH DAILY.   diclofenac  Sodium (VOLTAREN ) 1 % GEL Apply 2 g topically 4 (four) times daily as needed.   latanoprost  (XALATAN ) 0.005 % ophthalmic solution INSTILL 1 DROP INTO BOTH EYES AT BEDTIME   rosuvastatin  (CRESTOR ) 20 MG tablet TAKE 1 TABLET (20 MG TOTAL) BY MOUTH AT BEDTIME.   triamcinolone  cream (KENALOG ) 0.1 % APPLY TOPICALLY TWICE DAILY FOR 2 WEEKS, THEN AS NEEDED   EPINEPHrine  (EPIPEN  2-PAK) 0.3 mg/0.3 mL IJ SOAJ injection Inject 0.3 mg into the muscle as needed for anaphylaxis.   No facility-administered encounter medications on file as of 02/29/2024.    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 Recheck 2-3 years.    Past Surgical History:  Procedure Laterality Date   ABDOMINAL HYSTERECTOMY  1983   CESAREAN SECTION  1971   Family History  Problem Relation Age of Onset   Asthma Mother    Colon polyps Mother    Arthritis Mother  Birth defects Mother    Diabetes Mother    Depression Father    Breast cancer Sister    Cancer Sister    Colon polyps Sister    Heart attack Maternal Grandmother    Stroke Maternal Grandfather    Colon cancer Neg Hx    Esophageal cancer Neg Hx    Rectal cancer Neg Hx    Stomach cancer Neg Hx    Social History   Socioeconomic History   Marital status: Married    Spouse name: Not on file   Number of children: 1   Years of education: Not on file   Highest education level: Not on file  Occupational History   Occupation: Airline pilot retired  Tobacco Use   Smoking  status: Former    Types: Cigarettes    Passive exposure: Past   Smokeless tobacco: Never  Vaping Use   Vaping status: Never Used  Substance and Sexual Activity   Alcohol use: Yes    Alcohol/week: 1.0 standard drink of alcohol    Types: 1 Shots of liquor per week    Comment: occ   Drug use: Never   Sexual activity: Not on file  Other Topics Concern   Not on file  Social History Narrative   Not on file   Social Drivers of Health   Financial Resource Strain: Low Risk  (02/29/2024)   Overall Financial Resource Strain (CARDIA)    Difficulty of Paying Living Expenses: Not hard at all  Food Insecurity: No Food Insecurity (02/29/2024)   Hunger Vital Sign    Worried About Running Out of Food in the Last Year: Never true    Ran Out of Food in the Last Year: Never true  Transportation Needs: No Transportation Needs (02/29/2024)   PRAPARE - Administrator, Civil Service (Medical): No    Lack of Transportation (Non-Medical): No  Physical Activity: Insufficiently Active (02/29/2024)   Exercise Vital Sign    Days of Exercise per Week: 1 day    Minutes of Exercise per Session: 20 min  Stress: No Stress Concern Present (02/29/2024)   [employer] of Occupational Health - Occupational Stress Questionnaire    Feeling of Stress: Not at all  Social Connections: Moderately Isolated (02/29/2024)   Social Connection and Isolation Panel    Frequency of Communication with Friends and Family: More than three times a week    Frequency of Social Gatherings with Friends and Family: More than three times a week    Attends Religious Services: Never    Database administrator or Organizations: No    Attends Engineer, structural: Never    Marital Status: Married    Tobacco Counseling Counseling given: Not Answered    Clinical Intake:  Pre-visit preparation completed: Yes  Pain : No/denies pain     BMI - recorded: 34.63 Nutritional Status: BMI > 30  Obese Nutritional  Risks: None Diabetes: No  Lab Results  Component Value Date   HGBA1C 5.7 06/04/2022     How often do you need to have someone help you when you read instructions, pamphlets, or other written materials from your doctor or pharmacy?: 1 - Never  Interpreter Needed?: No  Information entered by :: Ellouise Haws, LPN   Activities of Daily Living     02/29/2024    8:35 AM  In your present state of health, do you have any difficulty performing the following activities:  Hearing? 0  Vision? 0  Difficulty concentrating  or making decisions? 1  Comment day but journal things to remember  Walking or climbing stairs? 0  Dressing or bathing? 0  Doing errands, shopping? 0  Preparing Food and eating ? N  Using the Toilet? N  In the past six months, have you accidently leaked urine? N  Do you have problems with loss of bowel control? N  Managing your Medications? N  Managing your Finances? N  Housekeeping or managing your Housekeeping? N    Patient Care Team: Jodie Lavern CROME, MD as PCP - General (Family Medicine) Jeneal Danita Macintosh, MD as Consulting Physician (Allergy) Legrand Victory CROME MOULD, MD as Consulting Physician (Gastroenterology)  I have updated your Care Teams any recent Medical Services you may have received from other providers in the past year.     Assessment:   This is a routine wellness examination for [place].  Hearing/Vision screen Hearing Screening - Comments:: Pt denies any hearing issues  Vision Screening - Comments:: Wears rx glasses - up to date with routine eye exams with [facility]     Goals Addressed   None    Depression Screen     02/29/2024    8:38 AM 02/25/2024    8:33 AM 08/24/2023    9:07 AM 05/25/2023    9:05 AM 02/05/2023   10:04 AM 12/17/2022    8:33 AM 06/04/2022    2:10 PM  PHQ 2/9 Scores  PHQ - 2 Score 0 1 0 0 0 0 0  PHQ- 9 Score     0 0     Fall Risk     02/29/2024    8:42 AM 02/25/2024    8:25 AM 08/24/2023    9:07 AM  05/25/2023    9:05 AM 02/05/2023   10:07 AM  Fall Risk   Falls in the past year? 0 0 0 0 0  Number falls in past yr: 0 0 0 0 0  Injury with Fall? 0 0 0 0 0  Risk for fall due to : No Fall Risks No Fall Risks No Fall Risks No Fall Risks Impaired vision  Follow up Falls prevention discussed Falls evaluation completed Falls evaluation completed Falls evaluation completed Falls prevention discussed    MEDICARE RISK AT HOME:  Medicare Risk at Home Any stairs in or around the home?: Yes If so, are there any without handrails?: No Home free of loose throw rugs in walkways, pet beds, electrical cords, etc?: Yes Adequate lighting in your home to reduce risk of falls?: Yes Life alert?: No Use of a cane, walker or w/c?: No Grab bars in the bathroom?: No Shower chair or bench in shower?: No Elevated toilet seat or a handicapped toilet?: No  TIMED UP AND GO:  Was the test performed?  No  Cognitive Function: 6CIT completed        02/29/2024    8:42 AM 02/05/2023   10:08 AM 01/30/2022    9:18 AM 01/24/2021    9:01 AM 12/19/2019    1:43 PM  6CIT Screen  What Year? 0 points 0 points 0 points 0 points 0 points  What month? 0 points 0 points 0 points 0 points 0 points  What time? 0 points 0 points 0 points  0 points  Count back from 20 0 points 0 points 0 points 0 points 0 points  Months in reverse 0 points 0 points 0 points 0 points 0 points  Repeat phrase 10 points 4 points 0 points 10 points  0 points  Total Score 10 points 4 points 0 points  0 points    Immunizations Immunization History  Administered Date(s) Administered   Fluad Quad(high Dose 65+) 05/22/2020, 05/20/2021, 06/04/2022   Fluad Trivalent(High Dose 65+) 05/25/2023   Influenza, High Dose Seasonal PF 06/09/2018   Influenza,inj,Quad PF,6+ Mos 06/08/2019   PFIZER(Purple Top)SARS-COV-2 Vaccination 12/15/2019, 01/09/2020, 07/29/2020, 12/21/2020   Pfizer Covid-19 Vaccine Bivalent Booster 2yrs & up 05/20/2021   Pneumococcal  Conjugate-13 09/06/2020   Pneumococcal Polysaccharide-23 08/08/2019   Tdap 07/08/2019   Zoster Recombinant(Shingrix ) 07/08/2019, 05/20/2021    Screening Tests Health Maintenance  Topic Date Due   MAMMOGRAM  09/27/2023   COVID-19 Vaccine (6 - 2024-25 season) 03/12/2024 (Originally 05/10/2023)   INFLUENZA VACCINE  04/08/2024   Medicare Annual Wellness (AWV)  02/28/2025   DEXA SCAN  02/17/2026   DTaP/Tdap/Td (2 - Td or Tdap) 07/07/2029   Colonoscopy  06/10/2033   Pneumococcal Vaccine: 50+ Years  Completed   Hepatitis C Screening  Completed   Zoster Vaccines- Shingrix   Completed   HPV VACCINES  Aged Out   Meningococcal B Vaccine  Aged Out   Fecal DNA (Cologuard)  Discontinued    Health Maintenance  Health Maintenance Due  Topic Date Due   MAMMOGRAM  09/27/2023   Health Maintenance Items Addressed: See Nurse Notes at the end of this note  Additional Screening:  Vision Screening: Recommended annual ophthalmology exams for early detection of glaucoma and other disorders of the eye. Would you like a referral to an eye doctor? No    Dental Screening: Recommended annual dental exams for proper oral hygiene  Community Resource Referral / Chronic Care Management: CRR required this visit?  No   CCM required this visit?  No   Plan:    I have personally reviewed and noted the following in the patient's chart:   Medical and social history Use of alcohol, tobacco or illicit drugs  Current medications and supplements including opioid prescriptions. Patient is not currently taking opioid prescriptions. Functional ability and status Nutritional status Physical activity Advanced directives List of other physicians Hospitalizations, surgeries, and ER visits in previous 12 months Vitals Screenings to include cognitive, depression, and falls Referrals and appointments  In addition, I have reviewed and discussed with patient certain preventive protocols, quality metrics, and best  practice recommendations. A written personalized care plan for preventive services as well as general preventive health recommendations were provided to patient.   Ellouise VEAR Haws, LPN   [id number]   After Visit Summary: (In Person-Printed) AVS printed and given to the patient  Notes: Nothing significant to report at this time.

## 2024-02-29 NOTE — Patient Instructions (Signed)
 Kathleen Morse , Thank you for taking time out of your busy schedule to complete your Annual Wellness Visit with me. I enjoyed our conversation and look forward to speaking with you again next year. I, as well as your care team,  appreciate your ongoing commitment to your health goals. Please review the following plan we discussed and let me know if I can assist you in the future. Your Game plan/ To Do List    Referrals: If you haven't heard from the office you've been referred to, please reach out to them at the phone provided.   Follow up Visits: Next Medicare AWV with our clinical staff: 03/06/25   Have you seen your provider in the last 6 months (3 months if uncontrolled diabetes)? Yes Next Office Visit with your provider: 08/2024  Clinician Recommendations:  Aim for 30 minutes of exercise or brisk walking, 6-8 glasses of water, and 5 servings of fruits and vegetables each day.       This is a list of the screening recommended for you and due dates:  Health Maintenance  Topic Date Due   Mammogram  09/27/2023   Medicare Annual Wellness Visit  02/05/2024   COVID-19 Vaccine (6 - 2024-25 season) 03/12/2024*   Flu Shot  04/08/2024   DEXA scan (bone density measurement)  02/17/2026   DTaP/Tdap/Td vaccine (2 - Td or Tdap) 07/07/2029   Colon Cancer Screening  06/10/2033   Pneumococcal Vaccine for age over 25  Completed   Hepatitis C Screening  Completed   Zoster (Shingles) Vaccine  Completed   HPV Vaccine  Aged Out   Meningitis B Vaccine  Aged Out   Cologuard (Stool DNA test)  Discontinued  *Topic was postponed. The date shown is not the original due date.    Advanced directives: (Declined) Advance directive discussed with you today. Even though you declined this today, please call our office should you change your mind, and we can give you the proper paperwork for you to fill out. Advance Care Planning is important because it:  [x]  Makes sure you receive the medical care that is  consistent with your values, goals, and preferences  [x]  It provides guidance to your family and loved ones and reduces their decisional burden about whether or not they are making the right decisions based on your wishes.  Follow the link provided in your after visit summary or read over the paperwork we have mailed to you to help you started getting your Advance Directives in place. If you need assistance in completing these, please reach out to us  so that we can help you!  See attachments for Preventive Care and Fall Prevention Tips.

## 2024-03-18 ENCOUNTER — Ambulatory Visit
Admission: RE | Admit: 2024-03-18 | Discharge: 2024-03-18 | Disposition: A | Discharge status: Home / Self Care | Source: Ambulatory Visit | Attending: Family Medicine | Admitting: Family Medicine

## 2024-03-18 DIAGNOSIS — Z1231 Encounter for screening mammogram for malignant neoplasm of breast: Secondary | ICD-10-CM | POA: Diagnosis not present

## 2024-04-09 ENCOUNTER — Other Ambulatory Visit: Payer: Self-pay | Admitting: Allergy

## 2024-06-01 ENCOUNTER — Other Ambulatory Visit: Payer: Self-pay | Admitting: Family

## 2024-06-21 DIAGNOSIS — K219 Gastro-esophageal reflux disease without esophagitis: Secondary | ICD-10-CM | POA: Diagnosis not present

## 2024-06-21 DIAGNOSIS — E669 Obesity, unspecified: Secondary | ICD-10-CM | POA: Diagnosis not present

## 2024-06-21 DIAGNOSIS — I10 Essential (primary) hypertension: Secondary | ICD-10-CM | POA: Diagnosis not present

## 2024-06-21 DIAGNOSIS — L309 Dermatitis, unspecified: Secondary | ICD-10-CM | POA: Diagnosis not present

## 2024-06-21 DIAGNOSIS — Z87891 Personal history of nicotine dependence: Secondary | ICD-10-CM | POA: Diagnosis not present

## 2024-06-21 DIAGNOSIS — J4489 Other specified chronic obstructive pulmonary disease: Secondary | ICD-10-CM | POA: Diagnosis not present

## 2024-06-21 DIAGNOSIS — M545 Low back pain, unspecified: Secondary | ICD-10-CM | POA: Diagnosis not present

## 2024-06-21 DIAGNOSIS — H409 Unspecified glaucoma: Secondary | ICD-10-CM | POA: Diagnosis not present

## 2024-06-21 DIAGNOSIS — E785 Hyperlipidemia, unspecified: Secondary | ICD-10-CM | POA: Diagnosis not present

## 2024-06-22 ENCOUNTER — Telehealth: Payer: Self-pay

## 2024-06-22 ENCOUNTER — Other Ambulatory Visit: Payer: Self-pay | Admitting: Family Medicine

## 2024-06-22 NOTE — Telephone Encounter (Signed)
 Results faxed.

## 2024-06-22 NOTE — Telephone Encounter (Signed)
 Copied from CRM [id number]. Topic: Clinical - Lab/Test Results >> Jun 21, 2024  1:31 PM Rea ORN wrote: Reason for CRM: Mel with [employer] called to request for pt mammogram results. Please fax to [phone].  Please call [phone] if there are any questions

## 2024-06-25 ENCOUNTER — Other Ambulatory Visit: Payer: Self-pay | Admitting: Family Medicine

## 2024-07-18 ENCOUNTER — Other Ambulatory Visit: Payer: Self-pay | Admitting: Family Medicine

## 2024-07-19 ENCOUNTER — Ambulatory Visit: Admitting: Family Medicine

## 2024-07-19 ENCOUNTER — Encounter: Payer: Self-pay | Admitting: Family Medicine

## 2024-07-19 VITALS — BP 138/82 | HR 123 | Temp 97.7°F | Ht 59.0 in | Wt 150.0 lb

## 2024-07-19 DIAGNOSIS — G301 Alzheimer's disease with late onset: Secondary | ICD-10-CM

## 2024-07-19 DIAGNOSIS — K319 Disease of stomach and duodenum, unspecified: Secondary | ICD-10-CM

## 2024-07-19 DIAGNOSIS — K5904 Chronic idiopathic constipation: Secondary | ICD-10-CM | POA: Diagnosis not present

## 2024-07-19 DIAGNOSIS — E669 Obesity, unspecified: Secondary | ICD-10-CM | POA: Diagnosis not present

## 2024-07-19 DIAGNOSIS — R7301 Impaired fasting glucose: Secondary | ICD-10-CM | POA: Diagnosis not present

## 2024-07-19 DIAGNOSIS — I10 Essential (primary) hypertension: Secondary | ICD-10-CM

## 2024-07-19 DIAGNOSIS — E782 Mixed hyperlipidemia: Secondary | ICD-10-CM | POA: Diagnosis not present

## 2024-07-19 DIAGNOSIS — F02A0 Dementia in other diseases classified elsewhere, mild, without behavioral disturbance, psychotic disturbance, mood disturbance, and anxiety: Secondary | ICD-10-CM | POA: Diagnosis not present

## 2024-07-19 LAB — HEMOGLOBIN A1C: Hgb A1c MFr Bld: 6.1 % (ref 4.6–6.5)

## 2024-07-19 LAB — CBC WITH DIFFERENTIAL/PLATELET
Basophils Absolute: 0.1 K/uL (ref 0.0–0.1)
Basophils Relative: 0.4 % (ref 0.0–3.0)
Eosinophils Absolute: 0.1 K/uL (ref 0.0–0.7)
Eosinophils Relative: 0.9 % (ref 0.0–5.0)
HCT: 35.7 % — ABNORMAL LOW (ref 36.0–46.0)
Hemoglobin: 11.9 g/dL — ABNORMAL LOW (ref 12.0–15.0)
Lymphocytes Relative: 19.4 % (ref 12.0–46.0)
Lymphs Abs: 2.5 K/uL (ref 0.7–4.0)
MCHC: 33.3 g/dL (ref 30.0–36.0)
MCV: 89.3 fl (ref 78.0–100.0)
Monocytes Absolute: 0.6 K/uL (ref 0.1–1.0)
Monocytes Relative: 4.5 % (ref 3.0–12.0)
Neutro Abs: 9.5 K/uL — ABNORMAL HIGH (ref 1.4–7.7)
Neutrophils Relative %: 74.8 % (ref 43.0–77.0)
Platelets: 384 K/uL (ref 150.0–400.0)
RBC: 4 Mil/uL (ref 3.87–5.11)
RDW: 13.3 % (ref 11.5–15.5)
WBC: 12.7 K/uL — ABNORMAL HIGH (ref 4.0–10.5)

## 2024-07-19 LAB — COMPREHENSIVE METABOLIC PANEL WITH GFR
ALT: 23 U/L (ref 0–35)
AST: 24 U/L (ref 0–37)
Albumin: 4.1 g/dL (ref 3.5–5.2)
Alkaline Phosphatase: 48 U/L (ref 39–117)
BUN: 13 mg/dL (ref 6–23)
CO2: 29 meq/L (ref 19–32)
Calcium: 9.2 mg/dL (ref 8.4–10.5)
Chloride: 94 meq/L — ABNORMAL LOW (ref 96–112)
Creatinine, Ser: 1.17 mg/dL (ref 0.40–1.20)
GFR: 46.51 mL/min — ABNORMAL LOW (ref >60.00)
Glucose, Bld: 139 mg/dL — ABNORMAL HIGH (ref 70–99)
Potassium: 3.1 meq/L — ABNORMAL LOW (ref 3.5–5.1)
Sodium: 135 meq/L (ref 135–145)
Total Bilirubin: 0.4 mg/dL (ref 0.2–1.2)
Total Protein: 7.1 g/dL (ref 6.0–8.3)

## 2024-07-19 LAB — LIPID PANEL
Cholesterol: 137 mg/dL (ref 0–200)
HDL: 46 mg/dL (ref >39.00)
LDL Cholesterol: 49 mg/dL (ref 0–99)
NonHDL: 91.13
Total CHOL/HDL Ratio: 3
Triglycerides: 209 mg/dL — ABNORMAL HIGH (ref 0.0–149.0)
VLDL: 41.8 mg/dL — ABNORMAL HIGH (ref 0.0–40.0)

## 2024-07-19 LAB — TSH: TSH: 1.96 u[IU]/mL (ref 0.35–5.50)

## 2024-07-19 MED ORDER — PANTOPRAZOLE SODIUM 20 MG PO TBEC: 20.0000 mg | DELAYED_RELEASE_TABLET | Freq: Every day | ORAL | 1 refills | Status: DC

## 2024-07-19 NOTE — Progress Notes (Signed)
 Subjective  CC:  Chief Complaint  Patient presents with   GI Problem    Pt stated that she has been having some GI problems for quite some time now. Its hard to explain but feels like bloating     HPI: Kathleen Morse is a 73 y.o. female who presents to the office today to address the problems listed above in the chief complaint. Discussed the use of AI scribe software for clinical note transcription with the patient, who gave verbal consent to proceed.  History of Present Illness Kathleen Morse is a 73 year old female who presents with chronic bloating and belching.  Gastrointestinal symptoms - Chronic bloating and belching present for an extended duration - Bloating occurs even after consuming small amounts of food or drink, such as half a cup of water or a piece of bread - Belching is constant and accompanies bloating - No associated abdominal pain - No heartburn or reflux symptoms - No identified dietary triggers; symptoms occur with all foods and even water - Bowel movements are regular  Dietary habits and weight changes - Diet primarily consists of fruits and vegetables with limited meat intake - Stable weight at approximately 148 pounds - denies daily alcohol, laxative or enema use  Prior evaluation and treatment; reviewed GI evaluations and allergist evaluations - Underwent upper endoscopy/colonoscopy and gastroenterology consultation approximately one to two years ago: negative celiac. Egd w/ gastropathy, no ulcers or h.pylori. + tubular adenoma by colonoscopy. No breath testing done for SIBO. Prescribed protonix  but not sure if pt took it.  -allergist w/ negative GI allergy evaluation.  - No prior use of stomach medications such as omeprazole  or antacids - No prior treatments recommended - husband is very frustrated and concerned about size of abdomen and bloating.   Pt with history of alzheimers.   HTN has been fairly well controlled. Mildly  elevated in office today Reports takes medications including statin. Due for lipid recheck.  Has h/o constipation, chronic. No known h/o IBS.  Pt does not seem bothered by the sxs.    Assessment  1. Gastropathy   2. Essential hypertension   3. Mild late onset Alzheimer's dementia without behavioral disturbance, psychotic disturbance, mood disturbance, or anxiety (HCC)   4. Mixed hyperlipidemia   5. Chronic idiopathic constipation   6. Obesity (BMI 30-39.9)   7. IFG (impaired fasting glucose)      Plan  Assessment and Plan Assessment & Plan Chronic bloating and belching/gastropathy Chronic bloating and belching for over a year, exacerbated by minimal food or fluid intake. No associated pain, heartburn, or reflux symptoms. Previous GI workup including endoscopy, colonoscopy, and blood work were unremarkable. Differential diagnosis includes aerophagia, dietary factors, irritable bowel syndrome, small intestinal bacterial overgrowth syndrome, and functional gastrointestinal disorder. Previous recommendation for Protonix  was not followed. Discussed potential benefits of dietary modifications, antacid therapy, and further GI evaluation for bacterial overgrowth syndrome. - Started Protonix  for acid reflux management. - Provided a handout for a FODMAP diet. - Ordered repeat blood work. - Referred back to GI for breath test to evaluate for small intestinal bacterial overgrowth syndrome. - offered SSRI or triclyclic but pt and husband defer for now.   HTN is fairly well controlled no changes today  Check lipids and A1c.   Constipation: pt reports better now.   Alzheimers: pt defers treatment. Husband reports stability. ? Aerophagia contributing.     Follow up: prn Orders Placed This Encounter  Procedures   Hemoglobin A1c  CBC with Differential/Platelet   Comprehensive metabolic panel with GFR   Lipid panel   TSH   Meds ordered this encounter  Medications   pantoprazole   (PROTONIX ) 20 MG tablet    Sig: Take 1 tablet (20 mg total) by mouth daily.    Dispense:  90 tablet    Refill:  1     I reviewed the patients updated PMH, FH, and SocHx.  Patient Active Problem List   Diagnosis Date Noted   Mild late onset Alzheimer's dementia without behavioral disturbance, psychotic disturbance, mood disturbance, or anxiety (HCC) 05/25/2023    Priority: High   Essential hypertension 08/25/2019    Priority: High   Mixed hyperlipidemia 08/25/2019    Priority: High   Tubular adenoma of colon 08/24/2023    Priority: Medium    Gastropathy 08/24/2023    Priority: Medium    Habitual alcohol use 12/17/2022    Priority: Medium    Spondylosis of cervical region without myelopathy or radiculopathy 04/09/2020    Priority: Medium    Osteopenia after menopause 12/27/2019    Priority: Medium    Glaucoma 09/30/2019    Priority: Medium    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 09/30/2019    Priority: Medium    Obesity (BMI 30-39.9) 09/30/2019    Priority: Medium    Moderate persistent asthma 08/25/2019    Priority: Medium    Laxative habit 12/17/2022    Priority: Low   Constipation 12/17/2022    Priority: Low   Intrinsic eczema 04/09/2020    Priority: Low   Lipoma of neck 04/09/2020    Priority: Low   Chronic midline low back pain without sciatica 09/30/2019    Priority: Low   Nut allergy 08/24/2023   Current Meds  Medication Sig   albuterol  (VENTOLIN  HFA) 108 (90 Base) MCG/ACT inhaler INHALE 1 TO 2 PUFFS EVERY 6 HOURS AS NEEDED FOR WHEEZING OR SHORTNESS OF BREATH.   b complex vitamins capsule Take 1 capsule by mouth daily.   Blood Pressure Monitoring (BLOOD PRESSURE CUFF) MISC Use as directed   chlorthalidone  (HYGROTON ) 25 MG tablet TAKE 1 TABLET (25 MG TOTAL) BY MOUTH DAILY.   diclofenac  Sodium (VOLTAREN ) 1 % GEL Apply 2 g topically 4 (four) times daily as needed.   EPINEPHrine  (EPIPEN  2-PAK) 0.3 mg/0.3 mL IJ SOAJ injection Inject  0.3 mg into the muscle as needed for anaphylaxis.   latanoprost  (XALATAN ) 0.005 % ophthalmic solution INSTILL 1 DROP INTO BOTH EYES AT BEDTIME   pantoprazole  (PROTONIX ) 20 MG tablet Take 1 tablet (20 mg total) by mouth daily.   rosuvastatin  (CRESTOR ) 20 MG tablet TAKE 1 TABLET AT BEDTIME   triamcinolone  cream (KENALOG ) 0.1 % APPLY TOPICALLY TWICE DAILY FOR 2 WEEKS, THEN AS NEEDED   Allergies: Patient is allergic to pineapple and penicillins. Family History: Patient family history includes Arthritis in her mother; Asthma in her mother; Birth defects in her mother; Breast cancer in her sister; Cancer in her sister; Colon polyps in her mother and sister; Depression in her father; Diabetes in her mother; Heart attack in her maternal grandmother; Stroke in her maternal grandfather. Social History:  Patient  reports that she has quit smoking. Her smoking use included cigarettes. She has been exposed to tobacco smoke. She has never used smokeless tobacco. She reports current alcohol use of about 1.0 standard drink of alcohol per week. She reports that she does not use drugs.  Review of Systems: Constitutional: Negative for fever malaise or anorexia  Cardiovascular: negative for chest pain Respiratory: negative for SOB or persistent cough Gastrointestinal: negative for abdominal pain  Objective  Vitals: BP 138/82   Pulse (!) 123   Temp 97.7 F (36.5 C)   Ht 4' 11 (1.499 m)   Wt 150 lb (68 kg)   SpO2 97%   BMI 30.30 kg/m  General: no acute distress , A&Ox3, appears well HEENT: PEERL, conjunctiva normal, neck is supple Cardiovascular:  RRR without murmur or gallop.  Respiratory:  Good breath sounds bilaterally, CTAB with normal respiratory effort Gastrointestinal: soft, flat abdomen, normal active bowel sounds, no palpable masses, no hepatosplenomegaly, no appreciated hernias Skin:  Warm, no rashes Commons side effects, risks, benefits, and alternatives for medications and treatment plan  prescribed today were discussed, and the patient expressed understanding of the given instructions. Patient is instructed to call or message via MyChart if he/she has any questions or concerns regarding our treatment plan. No barriers to understanding were identified. We discussed Red Flag symptoms and signs in detail. Patient expressed understanding regarding what to do in case of urgent or emergency type symptoms.  Medication list was reconciled, printed and provided to the patient in AVS. Patient instructions and summary information was reviewed with the patient as documented in the AVS. This note was prepared with assistance of Dragon voice recognition software. Occasional wrong-word or sound-a-like substitutions may have occurred due to the inherent limitations of voice recognition software

## 2024-07-19 NOTE — Patient Instructions (Signed)
 I will release your lab results to you on your MyChart account with further instructions. You may see the results before I do, but when I review them I will send you a message with my report or have my assistant call you if things need to be discussed. Please reply to my message with any questions. Thank you!    VISIT SUMMARY: Today, you were seen for chronic bloating and belching that has been ongoing for over a year. You have no associated pain, heartburn, or reflux symptoms, and previous evaluations including endoscopy and blood work were normal.  YOUR PLAN: -CHRONIC BLOATING AND BELCHING: Chronic bloating and belching can be caused by various factors including swallowing air, dietary issues, irritable bowel syndrome, bacterial overgrowth in the intestines, or functional gastrointestinal disorders. We have started you on Protonix  to help manage any potential acid reflux. You were also given a handout on the FODMAP diet, which may help identify and eliminate foods that could be contributing to your symptoms. Additionally, we have ordered repeat blood work and referred you back to the gastroenterologist for a breath test to check for small intestinal bacterial overgrowth syndrome.  INSTRUCTIONS: Please follow up with the gastroenterologist for the breath test as soon as possible. Continue taking Protonix  as prescribed and try to follow the FODMAP diet as outlined in the handout. We will review your blood work results at your next visit.   Low-FODMAP Eating Plan  FODMAP stands for fermentable oligosaccharides, disaccharides, monosaccharides, and polyols. These are sugars that are hard for some people to digest. A low-FODMAP eating plan may help some people who have irritable bowel syndrome (IBS) and certain other bowel (intestinal) diseases to manage their symptoms. This meal plan can be complicated to follow. Work with a diet and nutrition specialist (dietitian) to make a low-FODMAP eating plan that  is right for you. A dietitian can help make sure that you get enough nutrition from this diet. What are tips for following this plan? Reading food labels Check labels for hidden FODMAPs such as: High-fructose syrup. Honey. Agave. Natural fruit flavors. Onion or garlic powder. Choose low-FODMAP foods that contain 3-4 grams of fiber per serving. Check food labels for serving sizes. Eat only one serving at a time to make sure FODMAP levels stay low. Shopping Shop with a list of foods that are recommended on this diet and make a meal plan. Meal planning Follow a low-FODMAP eating plan for up to 6 weeks, or as told by your health care provider or dietitian. To follow the eating plan: Eliminate high-FODMAP foods from your diet completely. Choose only low-FODMAP foods to eat. You will do this for 2-6 weeks. Gradually reintroduce high-FODMAP foods into your diet one at a time. Most people should wait a few days before introducing the next new high-FODMAP food into their meal plan. Your dietitian can recommend how quickly you may reintroduce foods. Keep a daily record of what and how much you eat and drink. Make note of any symptoms that you have after eating. Review your daily record with a dietitian regularly to identify which foods you can eat and which foods you should avoid. General tips Drink enough fluid each day to keep your urine pale yellow. Avoid processed foods. These often have added sugar and may be high in FODMAPs. Avoid most dairy products, whole grains, and sweeteners. Work with a dietitian to make sure you get enough fiber in your diet. Avoid high FODMAP foods at meals to manage symptoms. Recommended foods  Fruits Bananas, oranges, tangerines, lemons, limes, blueberries, raspberries, strawberries, grapes, cantaloupe, honeydew melon, kiwi, papaya, passion fruit, and pineapple. Limited amounts of dried cranberries, banana chips, and shredded coconut. Vegetables Eggplant, zucchini,  cucumber, peppers, green beans, bean sprouts, lettuce, arugula, kale, Swiss chard, spinach, collard greens, bok choy, summer squash, potato, and tomato. Limited amounts of corn, carrot, and sweet potato. Green parts of scallions. Grains Gluten-free grains, such as rice, oats, buckwheat, quinoa, corn, polenta, and millet. Gluten-free pasta, bread, or cereal. Rice noodles. Corn tortillas. Meats and other proteins Unseasoned beef, pork, poultry, or fish. Eggs. [place]. Tofu (firm) and tempeh. Limited amounts of nuts and seeds, such as almonds, walnuts, brazil nuts, pecans, peanuts, nut butters, pumpkin seeds, chia seeds, and sunflower seeds. Dairy Lactose-free milk, yogurt, and kefir. Lactose-free cottage cheese and ice cream. Non-dairy milks, such as almond, coconut, hemp, and rice milk. Non-dairy yogurt. Limited amounts of goat cheese, brie, mozzarella, parmesan, swiss, and other hard cheeses. Fats and oils Butter-free spreads. Vegetable oils, such as olive, canola, and sunflower oil. Seasoning and other foods Artificial sweeteners with names that do not end in ol, such as aspartame, saccharine, and stevia. Maple syrup, white table sugar, raw sugar, brown sugar, and molasses. Mayonnaise, soy sauce, and tamari. Fresh basil, coriander, parsley, rosemary, and thyme. Beverages Water and mineral water. Sugar-sweetened soft drinks. Small amounts of orange juice or cranberry juice. Black and green tea. Most dry wines. Coffee. The items listed above may not be a complete list of foods and beverages you can eat. Contact a dietitian for more information. Foods to avoid Fruits Fresh, dried, and juiced forms of apple, pear, watermelon, peach, plum, cherries, apricots, blackberries, boysenberries, figs, nectarines, and mango. Avocado. Vegetables Chicory root, artichoke, asparagus, cabbage, snow peas, Brussels sprouts, broccoli, sugar snap peas, mushrooms, celery, and cauliflower. Onions, garlic, leeks, and the  white part of scallions. Grains Wheat, including kamut, durum, and semolina. Barley and bulgur. Couscous. Wheat-based cereals. Wheat noodles, bread, crackers, and pastries. Meats and other proteins Fried or fatty meat. Sausage. Cashews and pistachios. Soybeans, baked beans, black beans, chickpeas, kidney beans, fava beans, navy beans, lentils, black-eyed peas, and split peas. Dairy Milk, yogurt, ice cream, and soft cheese. Cream and sour cream. Milk-based sauces. Custard. Buttermilk. Soy milk. Seasoning and other foods Any sugar-free gum or candy. Foods that contain artificial sweeteners such as sorbitol, mannitol, isomalt, or xylitol. Foods that contain honey, high-fructose corn syrup, or agave. Bouillon, vegetable stock, beef stock, and chicken stock. Garlic and onion powder. Condiments made with onion, such as hummus, chutney, pickles, relish, salad dressing, and salsa. Tomato paste. Beverages Chicory-based drinks. Coffee substitutes. Chamomile tea. Fennel tea. Sweet or fortified wines such as port or sherry. Diet soft drinks made with isomalt, mannitol, maltitol, sorbitol, or xylitol. Apple, pear, and mango juice. Juices with high-fructose corn syrup. The items listed above may not be a complete list of foods and beverages you should avoid. Contact a dietitian for more information. Summary FODMAP stands for fermentable oligosaccharides, disaccharides, monosaccharides, and polyols. These are sugars that are hard for some people to digest. A low-FODMAP eating plan is a short-term diet that helps to ease symptoms of certain bowel diseases. The eating plan usually lasts up to 6 weeks. After that, high-FODMAP foods are reintroduced gradually and one at a time. This can help you find out which foods may be causing symptoms. A low-FODMAP eating plan can be complicated. It is best to work with a dietitian who has experience with this type of  plan. This information is not intended to replace advice given  to you by your health care provider. Make sure you discuss any questions you have with your health care provider. Document Revised: 08/09/2023 Document Reviewed: 08/09/2023 Elsevier Patient Education  2025 [employer].                   Contains text generated by [employer]bridge.                                 Contains text generated by [employer]bridge.

## 2024-07-22 ENCOUNTER — Ambulatory Visit: Payer: Self-pay | Admitting: Family Medicine

## 2024-07-22 ENCOUNTER — Other Ambulatory Visit: Payer: Self-pay

## 2024-07-22 MED ORDER — POTASSIUM CHLORIDE CRYS ER 20 MEQ PO TBCR: 20.0000 meq | EXTENDED_RELEASE_TABLET | Freq: Every day | ORAL | 1 refills | Status: AC

## 2024-07-22 NOTE — Progress Notes (Signed)
 Please call patient:labs are mostly stable. However, potassium is low due to BP pill. Please start kcl 20meq daily; please order for pt. Also sugars are running high in the prediabetic range. Need to decrease sweets.

## 2024-08-26 ENCOUNTER — Encounter: Payer: Self-pay | Admitting: Family Medicine

## 2024-08-26 ENCOUNTER — Ambulatory Visit: Admitting: Family Medicine

## 2024-08-26 VITALS — BP 120/72 | HR 99 | Temp 97.0°F | Ht 59.0 in | Wt 150.8 lb

## 2024-08-26 DIAGNOSIS — F02A0 Dementia in other diseases classified elsewhere, mild, without behavioral disturbance, psychotic disturbance, mood disturbance, and anxiety: Secondary | ICD-10-CM | POA: Diagnosis not present

## 2024-08-26 DIAGNOSIS — I10 Essential (primary) hypertension: Secondary | ICD-10-CM

## 2024-08-26 DIAGNOSIS — T502X5A Adverse effect of carbonic-anhydrase inhibitors, benzothiadiazides and other diuretics, initial encounter: Secondary | ICD-10-CM | POA: Diagnosis not present

## 2024-08-26 DIAGNOSIS — G301 Alzheimer's disease with late onset: Secondary | ICD-10-CM | POA: Diagnosis not present

## 2024-08-26 DIAGNOSIS — E876 Hypokalemia: Secondary | ICD-10-CM

## 2024-08-26 DIAGNOSIS — R7303 Prediabetes: Secondary | ICD-10-CM

## 2024-08-26 DIAGNOSIS — Z0001 Encounter for general adult medical examination with abnormal findings: Secondary | ICD-10-CM | POA: Diagnosis not present

## 2024-08-26 LAB — BASIC METABOLIC PANEL WITH GFR
BUN: 15 mg/dL (ref 6–23)
CO2: 29 meq/L (ref 19–32)
Calcium: 9.4 mg/dL (ref 8.4–10.5)
Chloride: 96 meq/L (ref 96–112)
Creatinine, Ser: 1.25 mg/dL — ABNORMAL HIGH (ref 0.40–1.20)
GFR: 42.93 mL/min — ABNORMAL LOW
Glucose, Bld: 132 mg/dL — ABNORMAL HIGH (ref 70–99)
Potassium: 3.1 meq/L — ABNORMAL LOW (ref 3.5–5.1)
Sodium: 136 meq/L (ref 135–145)

## 2024-08-26 MED ORDER — CVS BLOOD PRESSURE CUFF MISC: 1.0000 | 0 refills | Status: AC | PRN

## 2024-08-26 NOTE — Patient Instructions (Signed)
 Please return in 6 months for hypertension follow up.   I will release your lab results to you on your MyChart account with further instructions. You may see the results before I do, but when I review them I will send you a message with my report or have my assistant call you if things need to be discussed. Please reply to my message with any questions. Thank you!   If you have any questions or concerns, please don't hesitate to send me a message via MyChart or call the office at [phone]. Thank you for visiting with us  today! It's our pleasure caring for you.    VISIT SUMMARY: During your visit, we discussed your blood pressure, blood sugar levels, and memory. Your blood pressure readings at home are good, but we will replace your old blood pressure cuff. Your blood sugar levels were slightly elevated, likely due to candy consumption, which you have reduced. Your memory is stable, and you are managing well with written reminders. We also reviewed your general health maintenance, and all screenings are up to date.  YOUR PLAN: -ESSENTIAL HYPERTENSION: Essential hypertension means you have high blood pressure. Your home readings are good, but your blood pressure tends to be higher during office visits, possibly due to anxiety. Continue your current medication, and we have sent a prescription for a new blood pressure cuff to the pharmacy.  -PREDIABETES: Prediabetes means your blood sugar levels are higher than normal but not high enough to be classified as diabetes. Your recent blood sugar levels were slightly elevated, likely due to candy consumption, which you have reduced. We will recheck your blood sugar levels in six months.  -MILD LATE ONSET ALZHEIMER'S DEMENTIA: Mild late onset Alzheimer's dementia is a condition that affects memory and cognitive function. Your memory is stable, and you are managing well with the use of a notebook for reminders.  -GENERAL HEALTH MAINTENANCE: Your colon  cancer screening, mammograms, and immunizations are up to date. Continue with routine health maintenance screenings.  INSTRUCTIONS: We will recheck your blood sugar levels in six months. Continue taking your current blood pressure medication and using written reminders to assist with memory. A prescription for a new blood pressure cuff has been sent to your pharmacy.                      Contains text generated by [employer]bridge.                                 Contains text generated by [employer]bridge.

## 2024-08-26 NOTE — Progress Notes (Signed)
 " Subjective  Chief Complaint  Patient presents with   Medical Management of Chronic Issues    Here for medical management. No questions or concerns.     HPI: Kathleen Morse is a 73 y.o. female who presents to [facility] [place] today for a Female Wellness Visit. She also has the concerns and/or needs as listed above in the chief complaint. These will be addressed in addition to the Health Maintenance Visit.   Wellness Visit: annual visit with health maintenance review and exam  HM: screens current. Doing well overall. Husband concurs. Imms up to date.   Chronic disease f/u and/or acute problem visit: (deemed necessary to be done in addition to the wellness visit): Discussed the use of AI scribe software for clinical note transcription with the patient, who gave verbal consent to proceed.  History of Present Illness Kathleen Morse is a 73 year old female with hypertension who presents for routine follow-up.  Hypertension - Blood pressure readings at home typically range from 120/76 to 120/78 mmHg - Blood pressure tends to be higher during office visits, possibly due to anxiety - Current blood pressure cuff is seven years old and requires replacement - No chest pain - chlorthalidone  and potassium supp due for recheck due to low potassium last month of 3.1  Electrolyte supplementation - Currently taking a large potassium pill  IFG:  - Recent laboratory results showed slightly elevated blood glucose levels A1c 6.1 - Attributes elevated blood sugar to previous candy consumption, which has since been reduced - no sxs of hyperglycemia  alzheimers - Memory is stable - Uses written reminders to assist with memory - declines meds    Assessment  1. Encounter for well adult exam with abnormal findings   2. Prediabetes   3. Mild late onset Alzheimer's dementia without behavioral disturbance, psychotic disturbance, mood disturbance, or anxiety (HCC)    4. Essential hypertension   5. Diuretic-induced hypokalemia      Plan  Female Wellness Visit: Age appropriate Health Maintenance and Prevention measures were discussed with patient. Included topics are cancer screening recommendations, ways to keep healthy (see AVS) including dietary and exercise recommendations, regular eye and dental care, use of seat belts, and avoidance of moderate alcohol use and tobacco use.  BMI: discussed patient's BMI and encouraged positive lifestyle modifications to help get to or maintain a target BMI. HM needs and immunizations were addressed and ordered. See below for orders. See HM and immunization section for updates. Routine labs and screening tests ordered including cmp, cbc and lipids where appropriate. Discussed recommendations regarding Vit D and calcium  supplementation (see AVS)  Chronic disease management visit and/or acute problem visit: Assessment and Plan Assessment & Plan Essential hypertension w/ hypokalemia Blood pressure is slightly elevated in the office, likely due to anxiety. Home readings are consistently within normal range (120/76-78 mmHg). - Continue current blood pressure medication regimen. Cont chlorthalidone  and K+, check bmp - Sent prescription for a new blood pressure cuff to pharmacy.  Prediabetes Slightly elevated blood sugar levels. She has reduced candy intake. - Will recheck blood sugar levels in six months.  Mild late onset Alzheimer's dementia Memory is well-managed with the use of a notebook for reminders.  General Health Maintenance Colon cancer screening, mammograms, and immunizations are up to date. - Continue routine health maintenance screenings.    Follow up: 6 mo for recheck  Orders Placed This Encounter  Procedures   Basic metabolic panel with GFR  No orders of the defined types were placed in this encounter.     Body mass index is 30.46 kg/m. Wt Readings from Last 3 Encounters:  08/26/24 150  lb 12.8 oz (68.4 kg)  07/19/24 150 lb (68 kg)  02/29/24 149 lb (67.6 kg)     Patient Active Problem List   Diagnosis Date Noted Date Diagnosed   Mild late onset Alzheimer's dementia without behavioral disturbance, psychotic disturbance, mood disturbance, or anxiety (HCC) 05/25/2023     Priority: High    Failed aricept  2024 due to nightmares, declines further evaluation/medication Memory concerns: Addressed in the past. Worsening. Strong family history of Alzheimer's disease, both parents and now sister. Patient having trouble with remembering recent conversations. Gets confused on days of the week etc. Long-term memory seems to be stable. No loss of function. She lives with her husband who is with her all the time. Has history of habitual alcohol use but she has cut this down to almost nothing now. Her sister is now in assisted living due to dementia. This stresses her greatly. Both parents died from dementia mini-cog 1/3 05/2023     02/05/2023   10:08 AM 01/30/2022    9:18 AM 01/24/2021    9:01 AM 12/19/2019    1:43 PM  6CIT Screen  What Year? 0 points 0 points 0 points 0 points  What month? 0 points 0 points 0 points 0 points  What time? 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 Legrand, neg for H.pylori    Habitual alcohol use 12/17/2022     Priority: Medium    Spondylosis of cervical region without myelopathy or  radiculopathy 04/09/2020     Priority: Medium    Osteopenia after menopause 12/27/2019     Priority: Medium     DEXA 12/2019 T = -1.2 lowest, left femur. Recheck 2-3 years.  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 09/30/2019     Priority: Medium    Obesity (BMI 30-39.9) 09/30/2019     Priority: Medium    Moderate persistent asthma 08/25/2019     Priority: Medium    Laxative habit 12/17/2022     Priority: Low   Constipation 12/17/2022     Priority: Low   Intrinsic eczema 04/09/2020     Priority: Low   Lipoma of neck 04/09/2020     Priority: Low   Chronic midline low back pain without sciatica 09/30/2019     Priority: Low    PT 2022    Nut allergy 08/24/2023    Health Maintenance  Topic Date Due   COVID-19 Vaccine (9 -  2025-26 season) 12/15/2024   Medicare Annual Wellness (AWV)  02/28/2025   Mammogram  03/18/2025   Bone Density Scan  02/17/2026   DTaP/Tdap/Td (2 - Td or Tdap) 07/07/2029   Colonoscopy  06/10/2033   Pneumococcal Vaccine: 50+ Years  Completed   Influenza Vaccine  Completed   Hepatitis C Screening  Completed   Zoster Vaccines- Shingrix   Completed   Meningococcal B Vaccine  Aged Out   Fecal DNA (Cologuard)  Discontinued   Immunization History  Administered Date(s) Administered   Fluad Quad(high Dose 65+) 05/22/2020, 05/20/2021, 06/04/2022   Fluad Trivalent(High Dose 65+) 05/25/2023   INFLUENZA, HIGH DOSE SEASONAL PF 06/09/2018, 06/16/2024   Influenza,inj,Quad PF,6+ Mos 06/08/2019   Influenza-Unspecified 05/20/2021   PFIZER Comirnaty(Gray Top)Covid-19 Tri-Sucrose Vaccine 12/21/2020   PFIZER(Purple Top)SARS-COV-2 Vaccination 12/15/2019, 01/09/2020, 07/29/2020, 12/21/2020   Pfizer Covid-19 Vaccine Bivalent Booster 23yrs & up 05/20/2021   Pfizer(Comirnaty)Fall Seasonal Vaccine 12 years and older 07/25/2022, 07/02/2023,  06/16/2024   Pneumococcal Conjugate-13 09/06/2020   Pneumococcal Polysaccharide-23 08/08/2019   Tdap 07/08/2019   Zoster Recombinant(Shingrix ) 07/08/2019, 05/20/2021, 07/21/2021   We updated and reviewed the patient's past history in detail and it is documented below. Allergies: Patient is allergic to pineapple and penicillins. 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 Past Surgical History Patient  has a past surgical history that includes Abdominal hysterectomy (1983) and Cesarean section (1971). Family History: Patient family history includes Arthritis in her mother; Asthma in her mother; Birth defects in her mother; Breast cancer in her sister; Cancer in her sister; Colon polyps in her mother and sister; Depression in her father; Diabetes in her mother; Heart attack in her maternal grandmother; Stroke in her maternal grandfather. Social History:  Patient  reports that she has quit smoking. Her smoking use included cigarettes. She has been exposed to tobacco smoke. She has never used smokeless tobacco. She reports current alcohol use of about 1.0 standard drink of alcohol per week. She reports that she does not use drugs.  Review of Systems: Constitutional: negative for fever or malaise Ophthalmic: negative for photophobia, double vision or loss of vision Cardiovascular: negative for chest pain, dyspnea on exertion, or new LE swelling Respiratory: negative for SOB or persistent cough Gastrointestinal: negative for abdominal pain, change in bowel habits or melena Genitourinary: negative for dysuria or gross hematuria, no abnormal uterine bleeding or disharge Musculoskeletal: negative for new gait disturbance or  muscular weakness Integumentary: negative for new or persistent rashes, no breast lumps Neurological: negative for TIA or stroke symptoms Psychiatric: negative for SI or delusions Allergic/Immunologic: negative for hives  Patient Care Team    Relationship Specialty Notifications Start End  Jodie Lavern CROME, MD PCP - General Family Medicine  09/30/19   Jeneal Danita Macintosh, MD Consulting Physician Allergy  08/24/23   Legrand Victory CROME DOUGLAS, MD Consulting Physician Gastroenterology  08/24/23     Objective  Vitals: BP 120/72 Comment: by consistent home readings  Pulse 99   Temp (!) 97 F (36.1 C) (Temporal)   Ht 4' 11 (1.499 m)   Wt 150 lb 12.8 oz (68.4 kg)   SpO2 99%   BMI 30.46 kg/m  General:  Well developed, well nourished, no acute distress  Psych:  Alert and orientedx3,normal mood and affect HEENT:  Normocephalic, atraumatic, non-icteric sclera,  supple neck without  adenopathy, mass or thyromegaly Cardiovascular:  Normal S1, S2, RRR without gallop, rub or murmur Respiratory:  Good breath sounds bilaterally, CTAB with normal respiratory effort Gastrointestinal: normal bowel sounds, soft, non-tender, no noted masses. No HSM MSK: extremities without edema, joints without erythema or swelling Neurologic:    Mental status is normal.  Gross motor and sensory exams are normal.  No tremor  Commons side effects, risks, benefits, and alternatives for medications and treatment plan prescribed today were discussed, and the patient expressed understanding of the given instructions. Patient is instructed to call or message via MyChart if he/she has any questions or concerns regarding our treatment plan. No barriers to understanding were identified. We discussed Red Flag symptoms and signs in detail. Patient expressed understanding regarding what to do in case of urgent or emergency type symptoms.  Medication list was reconciled, printed and provided to the patient in AVS. Patient instructions  and summary information was reviewed with the patient as documented in the AVS. This note was prepared with assistance of Dragon voice recognition software. Occasional wrong-word or sound-a-like substitutions may have occurred due to the inherent limitations of voice recognition software     "

## 2024-09-02 ENCOUNTER — Other Ambulatory Visit: Payer: Self-pay

## 2024-09-02 ENCOUNTER — Telehealth: Payer: Self-pay | Admitting: Family Medicine

## 2024-09-02 ENCOUNTER — Ambulatory Visit: Payer: Self-pay | Admitting: Family Medicine

## 2024-09-02 DIAGNOSIS — E876 Hypokalemia: Secondary | ICD-10-CM

## 2024-09-02 NOTE — Progress Notes (Signed)
 Please call patient: potassium level remain low even on potassium supplement.  I recommend taking potassium twice a day for the next 5 days, then needs a lab visit for a BMP to recheck her potassium level; also order a magnesium level. Please order labs and schedule a lab visit.  I'd like to get the levels back to normal and then we can return to potassium once a day.  Other lab results look ok except for high sugars as we discussed.

## 2024-09-02 NOTE — Telephone Encounter (Signed)
 Patient is scheduled for Lab appointment 09/09/24

## 2024-09-05 ENCOUNTER — Other Ambulatory Visit: Payer: Self-pay | Admitting: Family Medicine

## 2024-09-09 ENCOUNTER — Other Ambulatory Visit

## 2024-09-09 DIAGNOSIS — E876 Hypokalemia: Secondary | ICD-10-CM

## 2024-09-09 LAB — BASIC METABOLIC PANEL WITH GFR
BUN: 12 mg/dL (ref 6–23)
CO2: 29 meq/L (ref 19–32)
Calcium: 9.3 mg/dL (ref 8.4–10.5)
Chloride: 86 meq/L — ABNORMAL LOW (ref 96–112)
Creatinine, Ser: 1.07 mg/dL (ref 0.40–1.20)
GFR: 51.72 mL/min — ABNORMAL LOW
Glucose, Bld: 135 mg/dL — ABNORMAL HIGH (ref 70–99)
Potassium: 3.1 meq/L — ABNORMAL LOW (ref 3.5–5.1)
Sodium: 127 meq/L — ABNORMAL LOW (ref 135–145)

## 2024-09-09 LAB — MAGNESIUM: Magnesium: 1.9 mg/dL (ref 1.5–2.5)

## 2024-09-14 ENCOUNTER — Ambulatory Visit: Payer: Self-pay | Admitting: Family Medicine

## 2024-09-14 ENCOUNTER — Other Ambulatory Visit: Payer: Self-pay

## 2024-09-14 DIAGNOSIS — E871 Hypo-osmolality and hyponatremia: Secondary | ICD-10-CM

## 2024-09-14 DIAGNOSIS — E876 Hypokalemia: Secondary | ICD-10-CM

## 2024-09-14 MED ORDER — AMLODIPINE BESYLATE 2.5 MG PO TABS: 2.5000 mg | ORAL_TABLET | Freq: Every day | ORAL | 3 refills | Status: AC

## 2024-09-14 NOTE — Progress Notes (Signed)
 Please call patient(can discuss with her husband due to mild dementia) :labs show that sodium and potassium are too low. This could become a serious problem. Need to change medications: STOP chlorthalidone . Take potassium daily and recheck BMP in 5-7 days(please schedule lab visit and order bmp for hyponatremia and hypokalemia). Very important to recheck blood in this time frame.   We will change to amlodipine  2.5mg  daily to treat high blood pressure. She may have this at home. If not, please order for her.   Rec office visit in 4 weeks to recheck bp and blood work again.

## 2024-09-21 ENCOUNTER — Other Ambulatory Visit: Payer: Self-pay | Admitting: Family Medicine

## 2024-09-21 ENCOUNTER — Ambulatory Visit: Admitting: Gastroenterology

## 2024-09-21 ENCOUNTER — Encounter: Payer: Self-pay | Admitting: Gastroenterology

## 2024-09-21 VITALS — BP 140/80 | HR 92 | Ht 59.0 in | Wt 152.5 lb

## 2024-09-21 DIAGNOSIS — R142 Eructation: Secondary | ICD-10-CM

## 2024-09-21 DIAGNOSIS — R140 Abdominal distension (gaseous): Secondary | ICD-10-CM

## 2024-09-21 MED ORDER — METRONIDAZOLE 250 MG PO TABS: 250.0000 mg | ORAL_TABLET | Freq: Three times a day (TID) | ORAL | 0 refills | Status: AC

## 2024-09-21 NOTE — Patient Instructions (Addendum)
 We have sent the following medications to your pharmacy for you to pick up at your convenience: metronidazole    ______  _______________________________________________________  Food Guidelines for those with chronic digestive trouble:  Many people have difficulty digesting certain foods, causing a variety of distressing and embarrassing symptoms such as abdominal pain, bloating and gas.  These foods may need to be avoided or consumed in small amounts.  Here are some tips that might be helpful for you.  1.   Lactose intolerance is the difficulty or complete inability to digest lactose, the natural sugar in milk and anything made from milk.  This condition is harmless, common, and can begin any time during life.  Some people can digest a modest amount of lactose while others cannot tolerate any.  Also, not all dairy products contain equal amounts of lactose.  For example, hard cheeses such as parmesan have less lactose than soft cheeses such as cheddar.  Yogurt has less lactose than milk or cheese.  Many packaged foods (even many brands of bread) have milk, so read ingredient lists carefully.  It is difficult to test for lactose intolerance, so just try avoiding lactose as much as possible for a week and see what happens with your symptoms.  If you seem to be lactose intolerant, the best plan is to avoid it (but make sure you get calcium  from another source).  The next best thing is to use lactase enzyme supplements, available over the counter everywhere.  Just know that many lactose intolerant people need to take several tablets with each serving of dairy to avoid symptoms.  Lastly, a lot of restaurant food is made with milk or butter.  Many are things you might not suspect, such as mashed potatoes, rice and pasta (cooked with butter) and grilled items.  If you are lactose intolerant, it never hurts to ask your server what has milk or butter.  2.   Fiber is an important part of your diet, but not all  fiber is well-tolerated.  Insoluble fiber such as bran is often consumed by normal gut bacteria and converted into gas.  Soluble fiber such as oats, squash, carrots and green beans are typically tolerated better.  3.   Some types of carbohydrates can be poorly digested.  Examples include: fructose (apples, cherries, pears, raisins and other dried fruits), fructans (onions, zucchini, large amounts of wheat), sorbitol/mannitol/xylitol and sucralose/Splenda (common artificial sweeteners), and raffinose (lentils, broccoli, cabbage, asparagus, brussel sprouts, many types of beans).  Do a programmer, multimedia for [employer] and you will find helpful information. Beano, a dietary supplement, will often help with raffinose-containing foods.  As with lactase tablets, you may need several per serving.  4.   Whenever possible, avoid processed food&meats and chemical additives.  High fructose corn syrup, a common sweetener, may be difficult to digest.  Eggs and soy (comes from the soybean, and added to many foods now) are other common bloating/gassy foods.  5.  Regarding gluten:  gluten is a protein mainly found in wheat, but also rye and barley.  There is a condition called celiac sprue, which is an inflammatory reaction in the small intestine causing a variety of digestive symptoms.  Blood testing is highly reliable to look for this condition, and sometimes upper endoscopy with small bowel biopsies may be necessary to make the diagnosis.  Many patients who test negative for celiac sprue report improvement in their digestive symptoms when they switch to a gluten-free diet.  However, in these non-celiac  gluten sensitive patients, the true role of gluten in their symptoms is unclear.  Reducing carbohydrates in general may decrease the gas and bloating caused when gut bacteria consume carbs. Also, some of these patients may actually be intolerant of the baker's yeast in bread products rather than the gluten.  Flatbread and other  reduced yeast breads might therefore be tolerated.  There is no specific testing available for most food intolerances, which are discovered mainly by dietary elimination.  Please do not embark on a gluten free diet unless directed by your doctor, as it is highly restrictive, and may lead to nutritional deficiencies if not carefully monitored.  Lastly, beware of internet claims offering personalized tests for food intolerances.  Such testing has no reliable scientific evidence to support its reliability and correlation to symptoms.    6.  The best advice is old advice, especially for those with chronic digestive trouble - try to eat clean.  Balanced diet, avoid processed food, plenty of fruits and vegetables, cut down the sugar, minimal alcohol, avoid tobacco. Make time to care for yourself, get enough sleep, exercise when you can, reduce stress.  Your guts will thank you for it.   - Dr. Victory Legrand [facility]  ____________________________________________________________

## 2024-09-21 NOTE — Progress Notes (Signed)
 "     [facility] Progress Note  Chief Complaint: Belching/bloating and distention  Subjective  Prior history   APP clinic visit July 2024 for suspected GERD with belching bloating, no dysphagia Also chronic constipation No show endoscopic procedures 05/21/2023 EGD/colonoscopy 06/11/2023 Redundant colon, diverticulosis, internal hemorrhoids, 3 diminutive polyps (at least 2 were adenomas) Essentially normal EGD with a single gastric erosion.  Biopsies negative for H. pylori or other abnormalities.  Discussed the use of AI scribe software for clinical note transcription with the patient, who gave verbal consent to proceed.  History of Present Illness Kathleen Morse is a 74 year old female who presents for evaluation of persistent belching and visible abdominal distension after eating.  Abdominal Distension and Flatulence: - Persistent visible abdominal swelling and frequent belching for at least one year - Symptoms occur after meals, regardless of food type - Belching episodes occur at least five times after eating - Abdominal distension is most pronounced after consuming bread - Occasionally takes an antihistamine for symptom relief - Uncertain if abdominal swelling is related to belching   Associated Gastrointestinal Symptoms: - No dysphagia, sensation of food impaction, loss of appetite, or unintentional weight loss  Bowel Habits and Diet: - Regular daily bowel movements without straining or difficulty - Diet high in vegetables and fruit - Drinks water regularly - Coffee stimulates bowel movements  Lifestyle Factors: - No tobacco use - Alcohol intake consists of champagne two to three times per week, typically on weekends - Not currently employed Non-smoker.  Does not chew gum but regularly has hard candies.   ROS: Cardiovascular:  no chest pain Respiratory: no dyspnea  The patient's Past Medical, Family and Social History were reviewed and are on file in the  [facility]. 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 Recheck 2-3 years.     Past Surgical History:  Procedure Laterality Date   ABDOMINAL HYSTERECTOMY  1983   CESAREAN SECTION  1971   COLONOSCOPY       Objective:  Med list reviewed Current Medications[1]   Vital signs in last 24 hrs: Vitals:   09/21/24 1044  BP: (!) 140/80  Pulse: 92   Wt Readings from Last 3 Encounters:  09/21/24 152 lb 8 oz (69.2 kg)  08/26/24 150 lb 12.8 oz (68.4 kg)  07/19/24 150 lb (68 kg)    Physical Exam Husband with her for entire visit Well-appearing Cardiac: Regular without appreciable murmur,  no peripheral edema Pulm: clear to auscultation bilaterally, normal RR and effort noted Abdomen: soft, nondistended, no focal tenderness, with active bowel sounds. No guarding or palpable hepatosplenomegaly.   Labs:   ___________________________________________ Radiologic studies:   ____________________________________________ Other:  Gastric biopsies as noted above _____________________________________________   Encounter Diagnoses  Name Primary?   Eructation Yes   Abdominal bloating     Assessment & Plan Abdominal bloating and belching Chronic, non-progressive bloating and belching without alarm features. - Reviewed current symptoms and dietary habits.  Decrease carbonated beverages and candy, anything that would cause air swallowing.  Describes supra gastric belching and need for slowing down meal time, being mindful of this process, breathing techniques.  Abdominal bloating and distention, sounds likely  functional and perhaps some maldigestion that is dietary related.  She does not feel  as if she gets excess flatus and her bowel habits are regular.  No clear risk factors for SIBO, but I have elected to treat her empirically for the possibility with a 7-day course of metronidazole .   Follow-up as needed     I spent total of 20 minutes in both face-to-face (15 minutes interview/exam) and non-face-to-face (5 minutes chart review, care coordination, documentation)  activities, excluding procedures performed, for the visit on the date of this encounter.   Victory LITTIE Brand III     [1]  Current Outpatient Medications:    albuterol  (VENTOLIN  HFA) 108 (90 Base) MCG/ACT inhaler, INHALE 1 TO 2 PUFFS EVERY 6 HOURS AS NEEDED FOR WHEEZING OR SHORTNESS OF BREATH., Disp: 18 g, Rfl: 1   amLODipine  (NORVASC ) 2.5 MG tablet, Take 1 tablet (2.5 mg total) by mouth daily., Disp: 90 tablet, Rfl: 3   b complex vitamins capsule, Take 1 capsule by mouth daily., Disp: , Rfl:    Blood Pressure Monitoring ([employer] BLOOD PRESSURE CUFF) MISC, Apply 1 kit topically as needed., Disp: 1 each, Rfl: 0   diclofenac  Sodium (VOLTAREN ) 1 % GEL, Apply 2 g topically 4 (four) times daily as needed., Disp: 350 g, Rfl: 5   metroNIDAZOLE  (FLAGYL ) 250 MG tablet, Take 1 tablet (250 mg total) by mouth 3 (three) times daily for 10 days., Disp: 30 tablet, Rfl: 0   pantoprazole  (PROTONIX ) 20 MG tablet, Take 1 tablet (20 mg total) by mouth daily., Disp: 90 tablet, Rfl: 1   potassium chloride  SA (KLOR-CON  M) 20 MEQ tablet, Take 1 tablet (20 mEq total) by mouth daily., Disp: 90 tablet, Rfl: 1   rosuvastatin  (CRESTOR ) 20 MG tablet, TAKE 1 TABLET AT BEDTIME, Disp: 90 tablet, Rfl: 3   triamcinolone  cream (KENALOG ) 0.1 %, APPLY TOPICALLY TWICE DAILY FOR 2 WEEKS, THEN AS NEEDED, Disp: 45 g, Rfl: 3   chlorthalidone  (HYGROTON ) 25 MG tablet, TAKE 1 TABLET EVERY DAY (Patient not taking: Reported on 09/21/2024), Disp: 90 tablet, Rfl: 3   EPINEPHrine  (EPIPEN  2-PAK) 0.3 mg/0.3 mL IJ SOAJ injection, Inject 0.3 mg into the muscle as needed for  anaphylaxis. (Patient not taking: Reported on 09/21/2024), Disp: 2 each, Rfl: 2   latanoprost  (XALATAN ) 0.005 % ophthalmic solution, INSTILL 1 DROP INTO BOTH EYES AT BEDTIME, Disp: 2.5 mL, Rfl: 3  "

## 2024-09-28 ENCOUNTER — Other Ambulatory Visit: Payer: Self-pay

## 2024-09-28 MED ORDER — PANTOPRAZOLE SODIUM 20 MG PO TBEC: 20.0000 mg | DELAYED_RELEASE_TABLET | Freq: Every day | ORAL | 1 refills | Status: AC

## 2024-09-28 NOTE — Telephone Encounter (Signed)
 Copied from CRM [id number]. Topic: Clinical - Medication Refill >> Sep 28, 2024  2:31 PM Viola F wrote: Medication: pantoprazole  (PROTONIX ) 20 MG tablet [[id number]]  Has the patient contacted their pharmacy? Yes (Agent: If no, request that the patient contact the pharmacy for the refill. If patient does not wish to contact the pharmacy document the reason why and proceed with request.) (Agent: If yes, when and what did the pharmacy advise?)  This is the patient's preferred pharmacy:   [facility] - [place], [place] - [address] [place] [address] Phone: [phone] Fax: [phone]  Is this the correct pharmacy for this prescription? Yes If no, delete pharmacy and type the correct one.   Has the prescription been filled recently? Yes  Is the patient out of the medication? Yes  Has the patient been seen for an appointment in the last year OR does the patient have an upcoming appointment? Yes  Can we respond through MyChart? Yes  Agent: Please be advised that Rx refills may take up to 3 business days. We ask that you follow-up with your pharmacy.

## 2024-09-28 NOTE — Telephone Encounter (Signed)
 Copied from CRM [id number]. Topic: Clinical - Medication Refill >> Sep 28, 2024  2:31 PM Viola F wrote: Medication: pantoprazole  (PROTONIX ) 20 MG tablet [[id number]]  Has the patient contacted their pharmacy? Yes (Agent: If no, request that the patient contact the pharmacy for the refill. If patient does not wish to contact the pharmacy document the reason why and proceed with request.) (Agent: If yes, when and what did the pharmacy advise?)  This is the patient's preferred pharmacy:   [facility] - [place], [place] - [address] [place] [address] Phone: [phone] Fax: [phone]  Is this the correct pharmacy for this prescription? Yes If no, delete pharmacy and type the correct one.   Has the prescription been filled recently? Yes  Is the patient out of the medication? Yes  Has the patient been seen for an appointment in the last year OR does the patient have an upcoming appointment? Yes  Can we respond through MyChart? Yes  Agent: Please be advised that Rx refills may take up to 3 business days. We ask that you follow-up with your pharmacy.

## 2025-03-06 ENCOUNTER — Ambulatory Visit
